# Patient Record
Sex: Female | Born: 1970 | Race: Black or African American | Hispanic: No | Marital: Single | Smoking: Never smoker
Health system: Southern US, Community
[De-identification: ages and names within clinical notes are randomized; demographics above are authoritative.]

## PROBLEM LIST (undated history)

## (undated) DIAGNOSIS — E119 Type 2 diabetes mellitus without complications: Secondary | ICD-10-CM

---

## 1998-07-20 ENCOUNTER — Emergency Department (HOSPITAL_COMMUNITY): Admission: EM | Admit: 1998-07-20 | Discharge: 1998-07-20 | Payer: Self-pay | Admitting: Emergency Medicine

## 2002-01-04 ENCOUNTER — Other Ambulatory Visit: Admission: RE | Admit: 2002-01-04 | Discharge: 2002-01-04 | Payer: Self-pay | Admitting: Internal Medicine

## 2002-01-12 ENCOUNTER — Emergency Department (HOSPITAL_COMMUNITY): Admission: EM | Admit: 2002-01-12 | Discharge: 2002-01-12 | Payer: Self-pay | Admitting: *Deleted

## 2002-01-12 ENCOUNTER — Encounter: Payer: Self-pay | Admitting: Emergency Medicine

## 2002-01-22 ENCOUNTER — Encounter: Payer: Self-pay | Admitting: Internal Medicine

## 2002-01-22 ENCOUNTER — Ambulatory Visit (HOSPITAL_COMMUNITY): Admission: RE | Admit: 2002-01-22 | Discharge: 2002-01-22 | Payer: Self-pay | Admitting: Internal Medicine

## 2002-03-09 ENCOUNTER — Encounter: Payer: Self-pay | Admitting: Internal Medicine

## 2002-03-09 ENCOUNTER — Ambulatory Visit (HOSPITAL_COMMUNITY): Admission: RE | Admit: 2002-03-09 | Discharge: 2002-03-09 | Payer: Self-pay | Admitting: Internal Medicine

## 2006-05-25 ENCOUNTER — Other Ambulatory Visit: Admission: RE | Admit: 2006-05-25 | Discharge: 2006-05-25 | Payer: Self-pay | Admitting: Endocrinology

## 2006-10-03 ENCOUNTER — Encounter: Admission: RE | Admit: 2006-10-03 | Discharge: 2006-10-03 | Payer: Self-pay | Admitting: *Deleted

## 2007-05-06 ENCOUNTER — Emergency Department (HOSPITAL_COMMUNITY): Admission: EM | Admit: 2007-05-06 | Discharge: 2007-05-06 | Payer: Self-pay | Admitting: Emergency Medicine

## 2007-08-21 ENCOUNTER — Other Ambulatory Visit: Admission: RE | Admit: 2007-08-21 | Discharge: 2007-08-21 | Payer: Self-pay | Admitting: Family Medicine

## 2008-09-03 ENCOUNTER — Other Ambulatory Visit: Admission: RE | Admit: 2008-09-03 | Discharge: 2008-09-03 | Payer: Self-pay | Admitting: Family Medicine

## 2009-09-23 ENCOUNTER — Other Ambulatory Visit: Admission: RE | Admit: 2009-09-23 | Discharge: 2009-09-23 | Payer: Self-pay | Admitting: Family Medicine

## 2009-09-30 ENCOUNTER — Encounter: Admission: RE | Admit: 2009-09-30 | Discharge: 2009-09-30 | Payer: Self-pay | Admitting: Internal Medicine

## 2010-10-01 ENCOUNTER — Encounter
Admission: RE | Admit: 2010-10-01 | Discharge: 2010-10-01 | Payer: Self-pay | Source: Home / Self Care | Attending: Internal Medicine | Admitting: Internal Medicine

## 2010-10-26 ENCOUNTER — Emergency Department (HOSPITAL_COMMUNITY)
Admission: EM | Admit: 2010-10-26 | Discharge: 2010-10-27 | Payer: Self-pay | Source: Home / Self Care | Admitting: Emergency Medicine

## 2011-02-12 ENCOUNTER — Other Ambulatory Visit (HOSPITAL_COMMUNITY)
Admission: RE | Admit: 2011-02-12 | Discharge: 2011-02-12 | Disposition: A | Discharge status: Home / Self Care | Payer: BC Managed Care – PPO | Source: Ambulatory Visit | Attending: Internal Medicine | Admitting: Internal Medicine

## 2011-02-12 ENCOUNTER — Other Ambulatory Visit: Payer: Self-pay

## 2011-02-12 DIAGNOSIS — Z01419 Encounter for gynecological examination (general) (routine) without abnormal findings: Secondary | ICD-10-CM | POA: Insufficient documentation

## 2011-11-12 ENCOUNTER — Other Ambulatory Visit: Payer: Self-pay | Admitting: Family Medicine

## 2011-11-12 DIAGNOSIS — Z1231 Encounter for screening mammogram for malignant neoplasm of breast: Secondary | ICD-10-CM

## 2011-11-22 ENCOUNTER — Ambulatory Visit
Admission: RE | Admit: 2011-11-22 | Discharge: 2011-11-22 | Disposition: A | Discharge status: Home / Self Care | Payer: BC Managed Care – PPO | Source: Ambulatory Visit | Attending: Family Medicine | Admitting: Family Medicine

## 2011-11-22 DIAGNOSIS — Z1231 Encounter for screening mammogram for malignant neoplasm of breast: Secondary | ICD-10-CM

## 2013-07-30 ENCOUNTER — Other Ambulatory Visit: Payer: Self-pay | Admitting: Family Medicine

## 2013-07-30 DIAGNOSIS — Z1231 Encounter for screening mammogram for malignant neoplasm of breast: Secondary | ICD-10-CM

## 2013-09-20 ENCOUNTER — Ambulatory Visit
Admission: RE | Admit: 2013-09-20 | Discharge: 2013-09-20 | Disposition: A | Discharge status: Home / Self Care | Payer: 59 | Source: Ambulatory Visit | Attending: Family Medicine | Admitting: Family Medicine

## 2013-09-20 DIAGNOSIS — Z1231 Encounter for screening mammogram for malignant neoplasm of breast: Secondary | ICD-10-CM

## 2014-02-09 ENCOUNTER — Encounter (HOSPITAL_COMMUNITY): Payer: Self-pay | Admitting: Emergency Medicine

## 2014-02-09 ENCOUNTER — Emergency Department (HOSPITAL_COMMUNITY)
Admission: EM | Admit: 2014-02-09 | Discharge: 2014-02-09 | Disposition: A | Discharge status: Home / Self Care | Payer: 59 | Source: Home / Self Care | Attending: Emergency Medicine | Admitting: Emergency Medicine

## 2014-02-09 DIAGNOSIS — J020 Streptococcal pharyngitis: Secondary | ICD-10-CM

## 2014-02-09 HISTORY — DX: Type 2 diabetes mellitus without complications: E11.9

## 2014-02-09 LAB — POCT RAPID STREP A: STREPTOCOCCUS, GROUP A SCREEN (DIRECT): POSITIVE — AB

## 2014-02-09 MED ORDER — AMOXICILLIN 500 MG PO CAPS: 500.0000 mg | ORAL_CAPSULE | Freq: Three times a day (TID) | ORAL | Status: DC

## 2014-02-09 MED ORDER — PREDNISONE 20 MG PO TABS: 20.0000 mg | ORAL_TABLET | Freq: Two times a day (BID) | ORAL | Status: DC

## 2014-02-09 NOTE — ED Provider Notes (Signed)
  Chief Complaint   Chief Complaint  Patient presents with  . Sore Throat    History of Present Illness   Michelle Mckinney is a 43 year old female who's had a two-day history of sore throat, pain on swallowing, and swollen glands. She denies fever, chills, headache, nasal congestion, rhinorrhea, or cough. No GI symptoms. No known exposure to strep. No prior history of strep.   Review of Systems   Other than as noted above, the patient denies any of the following symptoms. Systemic:  No fever, chills, sweats, myalgias, or headache. Eye:  No redness, pain or drainage. ENT:  No earache, nasal congestion, sneezing, rhinorrhea, sinus pressure, sinus pain, or post nasal drip. Lungs:  No cough, sputum production, wheezing, shortness of breath, or chest pain. GI:  No abdominal pain, nausea, vomiting, or diarrhea. Skin:  No rash.  PMFSH   Past medical history, family history, social history, meds, and allergies were reviewed. She takes the toes on metformin. She has a history of diabetes.  Physical Exam     Vital signs:  BP 124/53  Pulse 75  Temp(Src) 98.7 F (37.1 C) (Oral)  Resp 16  SpO2 96%  LMP 01/07/2014 General:  Alert, in no distress. Phonation was normal, no drooling, and patient was able to handle secretions well.  Eye:  No conjunctival injection or drainage. Lids were normal. ENT:  TMs and canals were normal, without erythema or inflammation.  Nasal mucosa was clear and uncongested, without drainage.  Mucous membranes were moist.  Exam of pharynx tonsils were enlarged and red with spots of white exudate.  There were no oral ulcerations or lesions. There was no bulging of the tonsillar pillars, and the uvula was midline. Neck:  Supple, no adenopathy, tenderness or mass. Lungs:  No respiratory distress.  Lungs were clear to auscultation, without wheezes, rales or rhonchi.  Breath sounds were clear and equal bilaterally.  Heart:  Regular rhythm, without gallops, murmers or  rubs. Skin:  Clear, warm, and dry, without rash or lesions.  Labs   Labs Reviewed  POCT RAPID STREP A ([facility] ONLY) - Abnormal; Notable for the following:    Streptococcus, Group A Screen (Direct) POSITIVE (*)    All other components within normal limits   Assessment   The encounter diagnosis was Strep throat.  There is no evidence of a peritonsillar abscess, retropharyngeal abscess, or epiglottitis.    Plan     1.  Meds:  The following meds were prescribed:   Discharge Medication List as of 02/09/2014 12:14 PM    START taking these medications   Details  amoxicillin (AMOXIL) 500 MG capsule Take 1 capsule (500 mg total) by mouth 3 (three) times daily., Starting 02/09/2014, Until Discontinued, Normal    predniSONE (DELTASONE) 20 MG tablet Take 1 tablet (20 mg total) by mouth 2 (two) times daily., Starting 02/09/2014, Until Discontinued, Normal        2.  Patient Education/Counseling:  The patient was given appropriate handouts, self care instructions, and instructed in symptomatic relief, including hot saline gargles, throat lozenges, infectious precautions, and need to trade out toothbrush.    3.  Follow up:  The patient was told to follow up here if no better in 3 to 4 days, or sooner if becoming worse in any way, and given some red flag symptoms such as difficulty swallowing or breathing which would prompt immediate return.       Reuben Likesavid C Kyanna Mahrt, MD 02/09/14 [phone]
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
## 2014-02-09 NOTE — Discharge Instructions (Signed)
Strep Throat  Strep throat is an infection of the throat caused by a bacteria named Streptococcus pyogenes. Your caregiver may call the infection streptococcal "tonsillitis" or "pharyngitis" depending on whether there are signs of inflammation in the tonsils or back of the throat. Strep throat is most common in children aged 42 15 years during the cold months of the year, but it can occur in people of any age during any season. This infection is spread from person to person (contagious) through coughing, sneezing, or other close contact.  SYMPTOMS   · Fever or chills.  · Painful, swollen, red tonsils or throat.  · Pain or difficulty when swallowing.  · White or yellow spots on the tonsils or throat.  · Swollen, tender lymph nodes or "glands" of the neck or under the jaw.  · Red rash all over the body (rare).  DIAGNOSIS   Many different infections can cause the same symptoms. A test must be done to confirm the diagnosis so the right treatment can be given. A "rapid strep test" can help your caregiver make the diagnosis in a few minutes. If this test is not available, a light swab of the infected area can be used for a throat culture test. If a throat culture test is done, results are usually available in a day or two.  TREATMENT   Strep throat is treated with antibiotic medicine.  HOME CARE INSTRUCTIONS   · Gargle with 1 tsp of salt in 1 cup of warm water, 3 4 times per day or as needed for comfort.  · Family members who also have a sore throat or fever should be tested for strep throat and treated with antibiotics if they have the strep infection.  · Make sure everyone in your household washes their hands well.  · Do not share food, drinking cups, or personal items that could cause the infection to spread to others.  · You may need to eat a soft food diet until your sore throat gets better.  · Drink enough water and fluids to keep your urine clear or pale yellow. This will help prevent dehydration.  · Get plenty of  rest.  · Stay home from school, daycare, or work until you have been on antibiotics for 24 hours.  · Only take over-the-counter or prescription medicines for pain, discomfort, or fever as directed by your caregiver.  · If antibiotics are prescribed, take them as directed. Finish them even if you start to feel better.  SEEK MEDICAL CARE IF:   · The glands in your neck continue to enlarge.  · You develop a rash, cough, or earache.  · You cough up green, yellow-brown, or bloody sputum.  · You have pain or discomfort not controlled by medicines.  · Your problems seem to be getting worse rather than better.  SEEK IMMEDIATE MEDICAL CARE IF:   · You develop any new symptoms such as vomiting, severe headache, stiff or painful neck, chest pain, shortness of breath, or trouble swallowing.  · You develop severe throat pain, drooling, or changes in your voice.  · You develop swelling of the neck, or the skin on the neck becomes red and tender.  · You have a fever.  · You develop signs of dehydration, such as fatigue, dry mouth, and decreased urination.  · You become increasingly sleepy, or you cannot wake up completely.  Document Released: 09/17/2000 Document Revised: 09/06/2012 Document Reviewed: 11/19/2010  ExitCare® Patient Information ©2014 [employer].

## 2014-09-03 ENCOUNTER — Encounter (HOSPITAL_COMMUNITY): Payer: Self-pay

## 2014-09-03 ENCOUNTER — Emergency Department (HOSPITAL_COMMUNITY)
Admission: EM | Admit: 2014-09-03 | Discharge: 2014-09-03 | Disposition: A | Discharge status: Home / Self Care | Payer: 59 | Attending: Emergency Medicine | Admitting: Emergency Medicine

## 2014-09-03 DIAGNOSIS — Z792 Long term (current) use of antibiotics: Secondary | ICD-10-CM | POA: Insufficient documentation

## 2014-09-03 DIAGNOSIS — S3992XA Unspecified injury of lower back, initial encounter: Secondary | ICD-10-CM | POA: Diagnosis not present

## 2014-09-03 DIAGNOSIS — E119 Type 2 diabetes mellitus without complications: Secondary | ICD-10-CM | POA: Insufficient documentation

## 2014-09-03 DIAGNOSIS — Z79899 Other long term (current) drug therapy: Secondary | ICD-10-CM | POA: Diagnosis not present

## 2014-09-03 DIAGNOSIS — Y9389 Activity, other specified: Secondary | ICD-10-CM | POA: Diagnosis not present

## 2014-09-03 DIAGNOSIS — Y92410 Unspecified street and highway as the place of occurrence of the external cause: Secondary | ICD-10-CM | POA: Diagnosis not present

## 2014-09-03 DIAGNOSIS — Z7952 Long term (current) use of systemic steroids: Secondary | ICD-10-CM | POA: Insufficient documentation

## 2014-09-03 DIAGNOSIS — Y998 Other external cause status: Secondary | ICD-10-CM | POA: Insufficient documentation

## 2014-09-03 MED ORDER — HYDROCODONE-ACETAMINOPHEN 5-325 MG PO TABS: 1.0000 | ORAL_TABLET | Freq: Four times a day (QID) | ORAL | Status: DC | PRN

## 2014-09-03 MED ORDER — HYDROCODONE-ACETAMINOPHEN 5-325 MG PO TABS: 2.0000 | ORAL_TABLET | Freq: Once | ORAL | Status: DC

## 2014-09-03 MED ORDER — HYDROCODONE-ACETAMINOPHEN 5-325 MG PO TABS
2.0000 | ORAL_TABLET | Freq: Once | ORAL | Status: AC
  Administered 2014-09-03: 2 via ORAL
  Filled 2014-09-03: qty 2
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
## 2014-09-03 NOTE — ED Notes (Signed)
MD at bedside. 
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
## 2014-09-11 ENCOUNTER — Other Ambulatory Visit: Payer: Self-pay

## 2014-09-11 DIAGNOSIS — Z1231 Encounter for screening mammogram for malignant neoplasm of breast: Secondary | ICD-10-CM

## 2014-09-23 ENCOUNTER — Ambulatory Visit
Admission: RE | Admit: 2014-09-23 | Discharge: 2014-09-23 | Disposition: A | Discharge status: Home / Self Care | Payer: 59 | Source: Ambulatory Visit

## 2014-09-23 DIAGNOSIS — Z1231 Encounter for screening mammogram for malignant neoplasm of breast: Secondary | ICD-10-CM

## 2014-10-07 ENCOUNTER — Other Ambulatory Visit: Payer: Self-pay | Admitting: Family Medicine

## 2014-10-07 ENCOUNTER — Other Ambulatory Visit (HOSPITAL_COMMUNITY)
Admission: RE | Admit: 2014-10-07 | Discharge: 2014-10-07 | Disposition: A | Discharge status: Home / Self Care | Payer: 59 | Source: Ambulatory Visit | Attending: Family Medicine | Admitting: Family Medicine

## 2014-10-07 DIAGNOSIS — Z01419 Encounter for gynecological examination (general) (routine) without abnormal findings: Secondary | ICD-10-CM | POA: Diagnosis not present

## 2014-10-09 LAB — CYTOLOGY - PAP

## 2015-10-22 ENCOUNTER — Other Ambulatory Visit: Payer: Self-pay

## 2015-10-22 DIAGNOSIS — Z1231 Encounter for screening mammogram for malignant neoplasm of breast: Secondary | ICD-10-CM

## 2015-11-04 ENCOUNTER — Ambulatory Visit
Admission: RE | Admit: 2015-11-04 | Discharge: 2015-11-04 | Disposition: A | Discharge status: Home / Self Care | Payer: BLUE CROSS/BLUE SHIELD | Source: Ambulatory Visit

## 2015-11-04 DIAGNOSIS — Z1231 Encounter for screening mammogram for malignant neoplasm of breast: Secondary | ICD-10-CM

## 2016-01-21 ENCOUNTER — Encounter: Payer: Self-pay | Admitting: Endocrinology

## 2016-01-21 ENCOUNTER — Ambulatory Visit (INDEPENDENT_AMBULATORY_CARE_PROVIDER_SITE_OTHER): Payer: BLUE CROSS/BLUE SHIELD | Admitting: Endocrinology

## 2016-01-21 VITALS — BP 126/82 | HR 81 | Temp 98.5°F | Resp 16 | Ht 67.0 in | Wt 205.4 lb

## 2016-01-21 DIAGNOSIS — E7800 Pure hypercholesterolemia, unspecified: Secondary | ICD-10-CM | POA: Diagnosis not present

## 2016-01-21 DIAGNOSIS — E1165 Type 2 diabetes mellitus with hyperglycemia: Secondary | ICD-10-CM | POA: Diagnosis not present

## 2016-01-21 LAB — POCT GLUCOSE (DEVICE FOR HOME USE): GLUCOSE FASTING, POC: 216 mg/dL — AB (ref 70–99)

## 2016-01-21 LAB — POCT GLYCOSYLATED HEMOGLOBIN (HGB A1C): HEMOGLOBIN A1C: 7.8

## 2016-01-21 MED ORDER — CANAGLIFLOZIN 100 MG PO TABS: ORAL_TABLET | ORAL | Status: DC

## 2016-01-21 NOTE — Patient Instructions (Signed)
Check blood sugars on waking up 3  times a week  Also check blood sugars about 2 hours after a meal and do this after different meals by rotation  Recommended blood sugar levels on waking up is 90-130 and about 2 hours after meal is 130-160  Please bring your blood sugar monitor to each visit, thank you  

## 2016-01-21 NOTE — Progress Notes (Signed)
Patient ID: Michelle Mckinney, female   DOB: Feb 01, 1971, 45 y.o.   MRN: [id number]           Reason for Appointment: Consultation for Type 2 Diabetes  Referring physician: Fulp   History of Present Illness:          Date of diagnosis of type 2 diabetes mellitus: 2014?       Background history:   She was initially diagnosed to have diabetes when she had symptoms of yeast infections, she thinks her blood sugar was just over 200.  She was initially given metformin and also subsequently tried on Januvia which she does not think helped much  Victoza was started probably in late 2014 but details are not available; she thinks initially with starting this her A1c may have gone down 6.1 However current information indicates her A1c has been over 9% since at least 11/2013  Recent history:   She is referred here for further management of her diabetes because of her persistently high A1c, last 10.7  Current management, blood sugar patterns and problems identified:  She thinks her blood sugars are relatively high 2-3 months ago my reading about 180-200 in the morning  Although her treatment regimen was not changed in her office visit about 2 months ago she was told to get back on her diet and start exercise which she has started doing especially in the last month  With this she has lost 5 pounds and she thinks her sugars are better although she checks readings in the mornings only  She has difficulty tolerating 1000 mg metformin tablets and has tried only the regular formulation  Non-insulin hypoglycemic drugs the patient is taking are: Victoza 1.8 mg daily, metformin 500 mg twice a day      Side effects from medications have been: Diarrhea from 1000 mg metformin  Compliance with the medical regimen: Inconsistent  Glucose monitoring:  done less than 2 times a day         Glucometer: One Touch.      Blood Glucose readings by recall   PREMEAL Breakfast Lunch Dinner Bedtime  Overall   Glucose  range: 130  110    Median:         Self-care: The diet that the patient has been following is: tries to limit carbohydrates .     Meal times are: He Dinner: 6-7 pm  Typical meal intake: Breakfast is oatmeal               Dietician visit, most recent:               Exercise:  1 mth 5/7; 60 min  Weight history: Wt Readings from Last 3 Encounters:  01/21/16 205 lb 6.4 oz (93.169 kg)  09/03/14 205 lb (92.987 kg)    Glycemic control:   Lab Results  Component Value Date   HGBA1C 7.8 01/21/2016   No results found for: GLUF, MICROALBUR, LDLCALC, CREATININE No results found for: MICRALBCREAT        Medication List       This list is accurate as of: 01/21/16  4:10 PM.  Always use your most recent med list.               BD PEN NEEDLE NANO U/F 32G X 4 MM Misc  Generic drug:  Insulin Pen Needle  USE TO INJ VICTOZA ONCE A DAY SUBCUTANEOUS 90 DAYS     metFORMIN 500 MG tablet  Commonly known as:  GLUCOPHAGE  Take by mouth daily with breakfast.     multivitamin with minerals Tabs tablet  Take 1 tablet by mouth daily.     ONE TOUCH ULTRA TEST test strip  Generic drug:  glucose blood  1 each by Other route as needed for other. Use as instructed     VICTOZA [place]  Inject into the skin.     vitamin E 1000 UNIT capsule  Take 1,000 Units by mouth daily.        Allergies: No Known Allergies  Past Medical History  Diagnosis Date  . Diabetes mellitus without complication (HCC)     No past surgical history on file.  Family History  Problem Relation Age of Onset  . Cancer Father   . Diabetes Sister   . Diabetes Maternal Aunt   . Hypertension Maternal Grandmother   . Stroke Maternal Grandfather   . Heart disease Paternal Grandfather     Social History:  reports that she has never smoked. She does not have any smokeless tobacco history on file. She reports that she does not drink alcohol. Her drug history is not on file.    Review of Systems    Lipid history:  Her last LDL was 134 in 1/17, previously 138 with normal triglycerides, HDL 33   No results found for: CHOL, HDL, LDLCALC, LDLDIRECT, TRIG, CHOLHDL         Hypertension:none  Most recent eye exam was 2015  Most recent foot exam: 01/2016  Review of Systems  Constitutional: Positive for weight loss. Negative for reduced appetite.  Eyes: Negative for blurred vision.  Respiratory: Negative for shortness of breath.   Cardiovascular: Negative for leg swelling.  Gastrointestinal: Negative for constipation and diarrhea.  Endocrine: Negative for fatigue.  Genitourinary: Positive for frequency.  Musculoskeletal: Negative for joint pain.  Skin: Negative for rash.  Neurological: Negative for numbness and tingling.  Psychiatric/Behavioral: Negative for insomnia.      LABS:  Office Visit on 01/21/2016  Component Date Value Ref Range Status  . Hemoglobin A1C 01/21/2016 7.8   Final  . Glucose Fasting, POC 01/21/2016 216* 70 - 99 mg/dL Final    Physical Examination:  BP 126/82 mmHg  Pulse 81  Temp(Src) 98.5 F (36.9 C)  Resp 16  Ht  (1.702 m)  Wt 205 lb 6.4 oz (93.169 kg)  BMI 32.16 kg/m2  SpO2 97%  GENERAL:         Patient has generalized obesity.   HEENT:         Eye exam shows normal external appearance. Fundus exam shows no retinopathy.  Oral exam shows normal mucosa .  NECK:   There is no lymphadenopathy Thyroid is not enlarged and no nodules felt.  Carotids are normal to palpation and no bruit heard LUNGS:       are clear to auscultation.Marland Kitchen   HEART:         Heart sounds:  S1 and S2 are normal. No murmur or click heard., no S3 or S4.   ABDOMEN:   There is no distention present. Liver and spleen are not palpable. No other mass or tenderness present.   NEUROLOGICAL:   Ankle jerks are absent bilaterally.Biceps reflexes are normal     Diabetic Foot Exam - Simple   Simple Foot Form  Diabetic Foot exam was performed with the following findings:  Yes 01/21/2016  3:05 PM    Visual Inspection  No deformities, no ulcerations, no other skin breakdown bilaterally:  Yes  Sensation Testing  Intact to touch and monofilament testing bilaterally:  Yes  Pulse Check  Posterior Tibialis and Dorsalis pulse intact bilaterally:  Yes  Comments             Vibration sense is Nearly normal  in distal first toes. MUSCULOSKELETAL:  There is no swelling or deformity of the peripheral joints. Spine is normal to inspection.   EXTREMITIES:     There is no edema. No skin lesions present.Marland Kitchen. SKIN:       No rash or lesions of concern.        ASSESSMENT:  Diabetes type 2, Obese, uncontrolled with A1c persistently over 9% in the last 2 years Currently on low-dose metformin and also Victoza However recently in the last 1-2 months with better compliance with diet and exercise regimen she appears to have improved blood sugars and A1c is 7.8  She probably has significant insulin resistance and some insulin deficiency Does need to lose weight and maintain BMI under at least  She is a good candidate for medication like Invokana as an add-on therapy for improved control, consistent long-term control and weight loss Discussed action of SGLT 2 drugs on lowering glucose by decreasing kidney absorption of glucose, benefits of weight loss and lower blood pressure, possible side effects including candidiasis and dosage regimen  She was also given the support program related to the medication and she can sign up at the website  given  Complications of diabetes: None, needs an eye exam which is overdue.  No evidence of nephropathy or neuropathy  HYPERLIPIDEMIA: Currently uncontrolled and untreated, LDL over 100  PLAN:     Invokana 100 mg daily in the morning  Consider metformin ER instead of regular metformin so that she can increase the dose to at least 1500 mg on the next visit  Continue Victoza 1.8 mg  To bring blood sugar monitor on each visit  Consultation with dietitian  Check  readings 2 hours after meals especially suppertime  Continue exercise regimen  Will recheck her lipids when blood sugars are better controlled, discussed importance of cardiovascular protection afforded by statin drugs and she most likely will need to start these  Needs regular  eye exams at least every 2 years, may wait to schedule till blood sugar control is better  Patient Instructions  Check blood sugars on waking up  3  times a week Also check blood sugars about 2 hours after a meal and do this after different meals by rotation  Recommended blood sugar levels on waking up is 90-130 and about 2 hours after meal is 130-160  Please bring your blood sugar monitor to each visit, thank you    Counseling time on subjects discussed above is over 50% of today's 50 minute visit  Anders Hohmann 01/21/2016, 4:10 PM   Note: This office note was prepared with Dragon voice recognition system technology. Any transcriptional errors that result from this process are unintentional.

## 2016-02-13 ENCOUNTER — Other Ambulatory Visit: Payer: BLUE CROSS/BLUE SHIELD

## 2016-02-18 ENCOUNTER — Ambulatory Visit: Payer: BLUE CROSS/BLUE SHIELD | Admitting: Endocrinology

## 2016-03-11 ENCOUNTER — Ambulatory Visit: Payer: BLUE CROSS/BLUE SHIELD | Admitting: Dietician

## 2016-05-10 ENCOUNTER — Other Ambulatory Visit: Payer: Self-pay | Admitting: Endocrinology

## 2016-09-03 ENCOUNTER — Other Ambulatory Visit: Payer: Self-pay | Admitting: Endocrinology

## 2016-11-15 ENCOUNTER — Other Ambulatory Visit: Payer: Self-pay | Admitting: Family Medicine

## 2016-11-15 DIAGNOSIS — Z1231 Encounter for screening mammogram for malignant neoplasm of breast: Secondary | ICD-10-CM

## 2016-11-24 ENCOUNTER — Other Ambulatory Visit: Payer: Self-pay | Admitting: Family

## 2016-11-24 DIAGNOSIS — Z1231 Encounter for screening mammogram for malignant neoplasm of breast: Secondary | ICD-10-CM

## 2016-12-09 ENCOUNTER — Ambulatory Visit
Admission: RE | Admit: 2016-12-09 | Discharge: 2016-12-09 | Disposition: A | Discharge status: Home / Self Care | Payer: BLUE CROSS/BLUE SHIELD | Source: Ambulatory Visit | Attending: Family | Admitting: Family

## 2016-12-09 DIAGNOSIS — Z1231 Encounter for screening mammogram for malignant neoplasm of breast: Secondary | ICD-10-CM | POA: Diagnosis not present

## 2016-12-20 DIAGNOSIS — E785 Hyperlipidemia, unspecified: Secondary | ICD-10-CM | POA: Diagnosis not present

## 2016-12-20 DIAGNOSIS — Z0001 Encounter for general adult medical examination with abnormal findings: Secondary | ICD-10-CM | POA: Diagnosis not present

## 2016-12-20 DIAGNOSIS — E1165 Type 2 diabetes mellitus with hyperglycemia: Secondary | ICD-10-CM | POA: Diagnosis not present

## 2017-04-09 ENCOUNTER — Encounter (HOSPITAL_COMMUNITY): Payer: Self-pay | Admitting: Emergency Medicine

## 2017-04-09 ENCOUNTER — Ambulatory Visit (HOSPITAL_COMMUNITY)
Admission: EM | Admit: 2017-04-09 | Discharge: 2017-04-09 | Disposition: A | Discharge status: Home / Self Care | Payer: BLUE CROSS/BLUE SHIELD | Attending: Family Medicine | Admitting: Family Medicine

## 2017-04-09 DIAGNOSIS — J0100 Acute maxillary sinusitis, unspecified: Secondary | ICD-10-CM

## 2017-04-09 MED ORDER — FLUCONAZOLE 150 MG PO TABS: 150.0000 mg | ORAL_TABLET | Freq: Once | ORAL | 1 refills | Status: AC

## 2017-04-09 MED ORDER — AMOXICILLIN-POT CLAVULANATE 875-125 MG PO TABS: 1.0000 | ORAL_TABLET | Freq: Two times a day (BID) | ORAL | 0 refills | Status: DC

## 2017-04-09 NOTE — Discharge Instructions (Signed)
Today you were diagnosed with the following: 1. Acute non-recurrent maxillary sinusitis    You have been prescribed prescription medications this visit.   Meds ordered this encounter  Medications   amoxicillin-clavulanate (AUGMENTIN) 875-125 MG tablet    Sig: Take 1 tablet by mouth every 12 (twelve) hours.    Dispense:  20 tablet    Refill:  0   fluconazole (DIFLUCAN) 150 MG tablet    Sig: Take 1 tablet (150 mg total) by mouth once.    Dispense:  1 tablet    Refill:  1    Please take all medications as directed.  If you are not improving over the next few days or feel you are worsening please follow up here or the Emergency Department if you are unable to see your regular doctor.

## 2017-04-09 NOTE — ED Provider Notes (Signed)
  [facility]   [id number] 04/09/17 Arrival Time: 1737  ASSESSMENT & PLAN:  Today you were diagnosed with the following: 1. Acute non-recurrent maxillary sinusitis    You have been prescribed prescription medications this visit.   Meds ordered this encounter  Medications  . amoxicillin-clavulanate (AUGMENTIN) 875-125 MG tablet    Sig: Take 1 tablet by mouth every 12 (twelve) hours.    Dispense:  20 tablet    Refill:  0  . fluconazole (DIFLUCAN) 150 MG tablet    Sig: Take 1 tablet (150 mg total) by mouth once.    Dispense:  1 tablet    Refill:  1   Please take all medications as directed.  If you are not improving over the next few days or feel you are worsening please follow up here or the Emergency Department if you are unable to see your regular doctor.  Reviewed expectations re: course of current medical issues. Questions answered. Outlined signs and symptoms indicating need for more acute intervention. Patient verbalized understanding. After Visit Summary given.  SUBJECTIVE:  Michelle Mckinney is a 46 y.o. female who presents with complaint of URI symptoms approx 2 weeks ago that were resolving. Was feeling better for 1-2 days then sinus pressure started. Has been worsening. Still with nasal congestion. Some pain in teeth. Afebrile. OTC without much relief. Non-smoker. No resp symptoms.  ROS: As per HPI.   OBJECTIVE:  Vitals:   04/09/17 1829  BP: 122/67  Pulse: 61  Resp: 18  Temp: 98.6 F (37 C)  TempSrc: Oral  SpO2: 100%    General appearance: alert; no distress Nose: nasal congestion; maxillary sinus tenderness Throat: lips, mucosa, and tongue normal; teeth and gums normal Lungs: clear to auscultation bilaterally Skin: warm and dry  No Known Allergies  PMHx, SurgHx, SocialHx, Medications, and Allergies were reviewed in the Visit Navigator and updated as appropriate.      Mardella LaymanHagler, Michelle Waltermire, MD 04/10/17 1147

## 2017-04-09 NOTE — ED Triage Notes (Signed)
Patient has headache, pressure behind right eye.  Face has a lot of pressure

## 2017-11-07 DIAGNOSIS — T148XXA Other injury of unspecified body region, initial encounter: Secondary | ICD-10-CM | POA: Diagnosis not present

## 2017-11-07 DIAGNOSIS — S6992XA Unspecified injury of left wrist, hand and finger(s), initial encounter: Secondary | ICD-10-CM | POA: Diagnosis not present

## 2017-11-07 DIAGNOSIS — E119 Type 2 diabetes mellitus without complications: Secondary | ICD-10-CM | POA: Diagnosis not present

## 2017-11-07 DIAGNOSIS — M79642 Pain in left hand: Secondary | ICD-10-CM | POA: Diagnosis not present

## 2017-11-07 DIAGNOSIS — R51 Headache: Secondary | ICD-10-CM | POA: Diagnosis not present

## 2017-11-07 DIAGNOSIS — S0101XA Laceration without foreign body of scalp, initial encounter: Secondary | ICD-10-CM | POA: Diagnosis not present

## 2017-11-07 DIAGNOSIS — S0003XA Contusion of scalp, initial encounter: Secondary | ICD-10-CM | POA: Diagnosis not present

## 2017-11-07 DIAGNOSIS — S0181XA Laceration without foreign body of other part of head, initial encounter: Secondary | ICD-10-CM | POA: Diagnosis not present

## 2017-11-07 DIAGNOSIS — Z7984 Long term (current) use of oral hypoglycemic drugs: Secondary | ICD-10-CM | POA: Diagnosis not present

## 2017-11-10 DIAGNOSIS — S060X0D Concussion without loss of consciousness, subsequent encounter: Secondary | ICD-10-CM | POA: Diagnosis not present

## 2017-11-10 DIAGNOSIS — S6392XA Sprain of unspecified part of left wrist and hand, initial encounter: Secondary | ICD-10-CM | POA: Diagnosis not present

## 2017-11-10 DIAGNOSIS — E1165 Type 2 diabetes mellitus with hyperglycemia: Secondary | ICD-10-CM | POA: Diagnosis not present

## 2017-11-10 DIAGNOSIS — S93491A Sprain of other ligament of right ankle, initial encounter: Secondary | ICD-10-CM | POA: Diagnosis not present

## 2017-11-14 ENCOUNTER — Other Ambulatory Visit: Payer: Self-pay | Admitting: Internal Medicine

## 2017-11-14 DIAGNOSIS — Z1231 Encounter for screening mammogram for malignant neoplasm of breast: Secondary | ICD-10-CM

## 2017-11-17 DIAGNOSIS — H40023 Open angle with borderline findings, high risk, bilateral: Secondary | ICD-10-CM | POA: Diagnosis not present

## 2017-11-22 DIAGNOSIS — F0781 Postconcussional syndrome: Secondary | ICD-10-CM | POA: Diagnosis not present

## 2017-11-22 DIAGNOSIS — S93401A Sprain of unspecified ligament of right ankle, initial encounter: Secondary | ICD-10-CM | POA: Diagnosis not present

## 2017-11-22 DIAGNOSIS — M79642 Pain in left hand: Secondary | ICD-10-CM | POA: Diagnosis not present

## 2017-12-02 DIAGNOSIS — F0781 Postconcussional syndrome: Secondary | ICD-10-CM | POA: Diagnosis not present

## 2017-12-02 DIAGNOSIS — S638X2D Sprain of other part of left wrist and hand, subsequent encounter: Secondary | ICD-10-CM | POA: Diagnosis not present

## 2017-12-02 DIAGNOSIS — M62838 Other muscle spasm: Secondary | ICD-10-CM | POA: Diagnosis not present

## 2017-12-12 ENCOUNTER — Ambulatory Visit
Admission: RE | Admit: 2017-12-12 | Discharge: 2017-12-12 | Disposition: A | Discharge status: Home / Self Care | Payer: BLUE CROSS/BLUE SHIELD | Source: Ambulatory Visit | Attending: Internal Medicine | Admitting: Internal Medicine

## 2017-12-12 DIAGNOSIS — Z1231 Encounter for screening mammogram for malignant neoplasm of breast: Secondary | ICD-10-CM | POA: Diagnosis not present

## 2017-12-16 DIAGNOSIS — F0781 Postconcussional syndrome: Secondary | ICD-10-CM | POA: Diagnosis not present

## 2017-12-29 DIAGNOSIS — S60222A Contusion of left hand, initial encounter: Secondary | ICD-10-CM | POA: Diagnosis not present

## 2017-12-29 DIAGNOSIS — M79642 Pain in left hand: Secondary | ICD-10-CM | POA: Diagnosis not present

## 2018-01-28 DIAGNOSIS — L03312 Cellulitis of back [any part except buttock]: Secondary | ICD-10-CM | POA: Diagnosis not present

## 2018-02-15 DIAGNOSIS — E1165 Type 2 diabetes mellitus with hyperglycemia: Secondary | ICD-10-CM | POA: Diagnosis not present

## 2018-02-15 DIAGNOSIS — Z124 Encounter for screening for malignant neoplasm of cervix: Secondary | ICD-10-CM | POA: Diagnosis not present

## 2018-02-15 DIAGNOSIS — R51 Headache: Secondary | ICD-10-CM | POA: Diagnosis not present

## 2018-02-15 DIAGNOSIS — E785 Hyperlipidemia, unspecified: Secondary | ICD-10-CM | POA: Diagnosis not present

## 2018-02-15 DIAGNOSIS — Z0000 Encounter for general adult medical examination without abnormal findings: Secondary | ICD-10-CM | POA: Diagnosis not present

## 2018-05-03 ENCOUNTER — Other Ambulatory Visit: Payer: Self-pay

## 2018-05-03 ENCOUNTER — Encounter (HOSPITAL_COMMUNITY): Payer: Self-pay | Admitting: Emergency Medicine

## 2018-05-03 ENCOUNTER — Ambulatory Visit (HOSPITAL_COMMUNITY)
Admission: EM | Admit: 2018-05-03 | Discharge: 2018-05-03 | Disposition: A | Discharge status: Home / Self Care | Payer: BLUE CROSS/BLUE SHIELD | Attending: Internal Medicine | Admitting: Internal Medicine

## 2018-05-03 DIAGNOSIS — J019 Acute sinusitis, unspecified: Secondary | ICD-10-CM

## 2018-05-03 MED ORDER — FLUCONAZOLE 150 MG PO TABS: 150.0000 mg | ORAL_TABLET | Freq: Once | ORAL | 0 refills | Status: AC

## 2018-05-03 MED ORDER — AMOXICILLIN-POT CLAVULANATE 875-125 MG PO TABS: 1.0000 | ORAL_TABLET | Freq: Two times a day (BID) | ORAL | 0 refills | Status: AC

## 2018-05-03 NOTE — ED Provider Notes (Signed)
[facility]    CSN: [id number] Arrival date & time: 05/03/18  1532     History   Chief Complaint Chief Complaint  Patient presents with  . URI    HPI Michelle Mckinney is a 47 y.o. female history of DM type II, Patient is presenting with URI symptoms- congestion, cough, sore throat.  Sore throat has relatively resolved. patient's main complaints are congestion and right ear pain.  She has felt fluid in her right ear, causing an achy sensation.  Symptoms have been going on for approximately 10 days. Patient has tried Flonase, Mucinex, Tylenol severe, with minimal relief. Denies fever, nausea, vomiting, diarrhea. Denies shortness of breath and chest pain.    HPI  Past Medical History:  Diagnosis Date  . Diabetes mellitus without complication [facility])     Patient Active Problem List   Diagnosis Date Noted  . Uncontrolled type 2 diabetes mellitus with hyperglycemia, without long-term current use of insulin (HCC) 01/21/2016    History reviewed. No pertinent surgical history.  OB History   None      Home Medications    Prior to Admission medications   Medication Sig Start Date End Date Taking? Authorizing Provider  BD PEN NEEDLE NANO U/F 32G X 4 MM MISC USE TO INJ VICTOZA ONCE A DAY SUBCUTANEOUS 90 DAYS 12/22/15  Yes [provider]  glucose blood (ONE TOUCH ULTRA TEST) test strip 1 each by Other route as needed for other. Use as instructed   Yes [provider]  Liraglutide (VICTOZA [place]) Inject into the skin.   Yes [provider]  metFORMIN (GLUCOPHAGE) 500 MG tablet Take by mouth daily with breakfast.   Yes [provider]  Multiple Vitamin (MULTIVITAMIN WITH MINERALS) TABS tablet Take 1 tablet by mouth daily.   Yes [provider]  vitamin E 1000 UNIT capsule Take 1,000 Units by mouth daily.   Yes [provider]  amoxicillin-clavulanate (AUGMENTIN) 875-125 MG tablet Take 1 tablet by mouth every 12 (twelve) hours  for 10 days. 05/03/18 05/13/18  Ruthie Berch, Junius Creamer, PA-C    Family History Family History  Problem Relation Age of Onset  . Cancer Father   . Diabetes Sister   . Diabetes Maternal Aunt   . Breast cancer Maternal Aunt   . Hypertension Maternal Grandmother   . Stroke Maternal Grandfather   . Heart disease Paternal Grandfather     Social History Social History   Tobacco Use  . Smoking status: Never Smoker  . Smokeless tobacco: Never Used  Substance Use Topics  . Alcohol use: No  . Drug use: Not on file     Allergies   Patient has no known allergies.   Review of Systems Review of Systems  Constitutional: Negative for activity change, appetite change, chills, fatigue and fever.  HENT: Positive for congestion, rhinorrhea and sinus pressure. Negative for ear pain, sore throat and trouble swallowing.   Eyes: Negative for discharge and redness.  Respiratory: Negative for cough, chest tightness and shortness of breath.   Cardiovascular: Negative for chest pain.  Gastrointestinal: Negative for abdominal pain, diarrhea, nausea and vomiting.  Musculoskeletal: Negative for myalgias.  Skin: Negative for rash.  Neurological: Negative for dizziness, light-headedness and headaches.     Physical Exam Triage Vital Signs ED Triage Vitals  Enc Vitals Group     BP 05/03/18 1540 137/87     Pulse Rate 05/03/18 1540 81     Resp 05/03/18 1540 18  Temp 05/03/18 1540 98.2 F (36.8 C)     Temp Source 05/03/18 1540 Oral     SpO2 05/03/18 1540 99 %     Weight --      Height --      Head Circumference --      Peak Flow --      Pain Score 05/03/18 1541 0     Pain Loc --      Pain Edu? --      Excl. in GC? --    No data found.  Updated Vital Signs BP 137/87 (BP Location: Left Arm)   Pulse 81   Temp 98.2 F (36.8 C) (Oral)   Resp 18   LMP 04/09/2018 (Exact Date)   SpO2 99%   Visual Acuity Right Eye Distance:   Left Eye Distance:   Bilateral Distance:    Right Eye Near:     Left Eye Near:    Bilateral Near:     Physical Exam  Constitutional: She appears well-developed and well-nourished. No distress.  HENT:  Head: Normocephalic and atraumatic.  Bilateral ears without tenderness to palpation of external auricle, tragus and mastoid, EAC's without erythema or swelling, TM's with good bony landmarks and cone of light. Non erythematous.  Fluid does appear to be present behind right TM, but without erythema.  Oral mucosa pink and moist, no tonsillar enlargement or exudate. Posterior pharynx patent and nonerythematous, no uvula deviation or swelling. Normal phonation.  Eyes: Conjunctivae are normal.  Neck: Neck supple.  Cardiovascular: Normal rate and regular rhythm.  No murmur heard. Pulmonary/Chest: Effort normal and breath sounds normal. No respiratory distress.  Breathing comfortably at rest, CTABL, no wheezing, rales or other adventitious sounds auscultated  Abdominal: Soft. There is no tenderness.  Musculoskeletal: She exhibits no edema.  Neurological: She is alert.  Skin: Skin is warm and dry.  Psychiatric: She has a normal mood and affect.  Nursing note and vitals reviewed.    UC Treatments / Results  Labs (all labs ordered are listed, but only abnormal results are displayed) Labs Reviewed - No data to display  EKG None  Radiology No results found.  Procedures Procedures (including critical care time)  Medications Ordered in UC Medications - No data to display  Initial Impression / Assessment and Plan / UC Course  I have reviewed the triage vital signs and the nursing notes.  Pertinent labs & imaging results that were available during my care of the patient were reviewed by me and considered in my medical decision making (see chart for details).     Patient with URI symptoms for 10 days, will go ahead and treat for sinusitis with Augmentin.  Will recommend to continue symptomatic management as well, will add in Zyrtec/Claritin with  Flonase.  Discussed further OTC recommendations.Discussed strict return precautions. Patient verbalized understanding and is agreeable with plan.  Final Clinical Impressions(s) / UC Diagnoses   Final diagnoses:  Acute sinusitis with symptoms > 10 days     Discharge Instructions     Please begin taking Augmentin twice daily for the next 10 days Please add in daily allergy pill like Zyrtec or Claritin daily for the next 1 to 2 weeks, please continue to pare with the Flonase nasal spray or Nasacort Treating your congestion should continue to improve her ear pain; you may use Tylenol or ibuprofen for discomfort in the meantime  Please continue to monitor your symptoms, please follow-up if symptoms are not improving, worsening, developing shortness of  breath, difficulty breathing, chest discomfort, fevers   ED Prescriptions    Medication Sig Dispense Auth. Provider   amoxicillin-clavulanate (AUGMENTIN) 875-125 MG tablet Take 1 tablet by mouth every 12 (twelve) hours for 10 days. 20 tablet Sharlene Mccluskey, [place] C, PA-C     Controlled Substance Prescriptions [place] Controlled Substance Registry consulted? Not Applicable   Lew Dawes, [place] 05/03/18 1605

## 2018-05-03 NOTE — ED Triage Notes (Signed)
Pt reports chest and nasal congestion and drainage with cough, right ear fullness and headache for 1 week.

## 2018-05-03 NOTE — Discharge Instructions (Signed)
Please begin taking Augmentin twice daily for the next 10 days Please add in daily allergy pill like Zyrtec or Claritin daily for the next 1 to 2 weeks, please continue to pare with the Flonase nasal spray or Nasacort Treating your congestion should continue to improve her ear pain; you may use Tylenol or ibuprofen for discomfort in the meantime  Please continue to monitor your symptoms, please follow-up if symptoms are not improving, worsening, developing shortness of breath, difficulty breathing, chest discomfort, fevers

## 2018-05-25 ENCOUNTER — Ambulatory Visit (HOSPITAL_COMMUNITY)
Admission: EM | Admit: 2018-05-25 | Discharge: 2018-05-25 | Disposition: A | Discharge status: Home / Self Care | Payer: BLUE CROSS/BLUE SHIELD | Attending: Family Medicine | Admitting: Family Medicine

## 2018-05-25 ENCOUNTER — Encounter (HOSPITAL_COMMUNITY): Payer: Self-pay

## 2018-05-25 ENCOUNTER — Other Ambulatory Visit: Payer: Self-pay

## 2018-05-25 DIAGNOSIS — R220 Localized swelling, mass and lump, head: Secondary | ICD-10-CM

## 2018-05-25 NOTE — ED Triage Notes (Signed)
[facility] from rt eye to below chin since yesterday 05/24/18, pt thinks may be possible allergic reaction.

## 2018-05-25 NOTE — Discharge Instructions (Addendum)
Continue Benadryl for the next 24 hours. 

## 2018-05-30 NOTE — ED Provider Notes (Signed)
[facility]   [id number] 05/25/18 Arrival Time: 1904  ASSESSMENT & PLAN:  1. Facial swelling    Question allergic reaction. Improving with Benadryl. Continue for the next 24 hours.  Follow-up Information    Boneta LucksBrown, Jennifer, NP.   Specialty:  Nurse Practitioner Why:  As needed. Contact information: [address] [place] [address] [phone]        [facility].   Specialty:  Urgent Care Why:  If symptoms worsen. Contact information: [address] [place] [id number] [phone]         Reviewed expectations re: course of current medical issues. Questions answered. Outlined signs and symptoms indicating need for more acute intervention. Patient verbalized understanding. After Visit Summary given.   SUBJECTIVE:  Michelle Mckinney is a 47 y.o. female who presents with a skin complaint.   Location: reports mild 'swelling of my face'; R-sided Onset: gradual; yesterday Pruritic? Yes; mild Painful? No Progression: stable  Drainage? No  Known trigger? No  New soaps/lotions/topicals/detergents/medications? No Environmental exposures or allergies? none Contacts with similar? No Recent travel? No  Other associated symptoms: none Therapies tried thus far: Benadryl with some relief Denies fever and myalgia. No specific aggravating or alleviating factors reported.   ROS: As per HPI.  OBJECTIVE: Vitals:   05/25/18 1916 05/25/18 1917  BP: 136/87   Pulse: 81   Resp: 17   Temp: 98.4 F (36.9 C)   TempSrc: Oral   SpO2: 99% 99%    General appearance: alert; no distress Lungs: clear to auscultation bilaterally Heart: regular rate and rhythm Extremities: no edema Skin: warm and dry; signs of infection: slight swelling of R cheek with minimal erythema and some warmth compared to the L cheek; no lip swelling or angioedema Psychological: alert and cooperative; normal mood and affect  No Known  Allergies  Past Medical History:  Diagnosis Date  . Diabetes mellitus without complication [facility](HCC)    Social History   Socioeconomic History  . Marital status: Single    Spouse name: Not on file  . Number of children: Not on file  . Years of education: Not on file  . Highest education level: Not on file  Occupational History  . Not on file  Social Needs  . Financial resource strain: Not on file  . Food insecurity:    Worry: Not on file    Inability: Not on file  . Transportation needs:    Medical: Not on file    Non-medical: Not on file  Tobacco Use  . Smoking status: Never Smoker  . Smokeless tobacco: Never Used  Substance and Sexual Activity  . Alcohol use: No  . Drug use: Never  . Sexual activity: Yes  Lifestyle  . Physical activity:    Days per week: Not on file    Minutes per session: Not on file  . Stress: Not on file  Relationships  . Social connections:    Talks on phone: Not on file    Gets together: Not on file    Attends religious service: Not on file    Active member of club or organization: Not on file    Attends meetings of clubs or organizations: Not on file    Relationship status: Not on file  . Intimate partner violence:    Fear of current or ex partner: Not on file    Emotionally abused: Not on file    Physically abused: Not on file  Forced sexual activity: Not on file  Other Topics Concern  . Not on file  Social History Narrative  . Not on file   Family History  Problem Relation Age of Onset  . Cancer Father   . Diabetes Sister   . Diabetes Maternal Aunt   . Breast cancer Maternal Aunt   . Hypertension Maternal Grandmother   . Stroke Maternal Grandfather   . Heart disease Paternal Grandfather    History reviewed. No pertinent surgical history.   Mardella Layman, MD 05/30/18 1118

## 2018-10-10 DIAGNOSIS — E1165 Type 2 diabetes mellitus with hyperglycemia: Secondary | ICD-10-CM | POA: Diagnosis not present

## 2018-10-10 DIAGNOSIS — E785 Hyperlipidemia, unspecified: Secondary | ICD-10-CM | POA: Diagnosis not present

## 2018-12-19 DIAGNOSIS — E119 Type 2 diabetes mellitus without complications: Secondary | ICD-10-CM | POA: Diagnosis not present

## 2018-12-25 ENCOUNTER — Other Ambulatory Visit: Payer: Self-pay | Admitting: Physician Assistant

## 2018-12-25 DIAGNOSIS — Z1231 Encounter for screening mammogram for malignant neoplasm of breast: Secondary | ICD-10-CM

## 2019-02-05 ENCOUNTER — Ambulatory Visit: Payer: BLUE CROSS/BLUE SHIELD

## 2019-02-23 DIAGNOSIS — L0291 Cutaneous abscess, unspecified: Secondary | ICD-10-CM | POA: Diagnosis not present

## 2019-02-23 DIAGNOSIS — R21 Rash and other nonspecific skin eruption: Secondary | ICD-10-CM | POA: Diagnosis not present

## 2019-03-26 ENCOUNTER — Ambulatory Visit: Payer: BLUE CROSS/BLUE SHIELD

## 2019-04-13 DIAGNOSIS — E1165 Type 2 diabetes mellitus with hyperglycemia: Secondary | ICD-10-CM | POA: Diagnosis not present

## 2019-04-13 DIAGNOSIS — E785 Hyperlipidemia, unspecified: Secondary | ICD-10-CM | POA: Diagnosis not present

## 2019-04-24 DIAGNOSIS — E785 Hyperlipidemia, unspecified: Secondary | ICD-10-CM | POA: Diagnosis not present

## 2019-04-24 DIAGNOSIS — E1165 Type 2 diabetes mellitus with hyperglycemia: Secondary | ICD-10-CM | POA: Diagnosis not present

## 2020-03-04 DIAGNOSIS — E119 Type 2 diabetes mellitus without complications: Secondary | ICD-10-CM | POA: Diagnosis not present

## 2020-07-21 DIAGNOSIS — E1165 Type 2 diabetes mellitus with hyperglycemia: Secondary | ICD-10-CM | POA: Diagnosis not present

## 2020-07-21 DIAGNOSIS — E785 Hyperlipidemia, unspecified: Secondary | ICD-10-CM | POA: Diagnosis not present

## 2020-10-15 DIAGNOSIS — Z20822 Contact with and (suspected) exposure to covid-19: Secondary | ICD-10-CM | POA: Diagnosis not present

## 2020-10-30 DIAGNOSIS — E1165 Type 2 diabetes mellitus with hyperglycemia: Secondary | ICD-10-CM | POA: Diagnosis not present

## 2020-10-30 DIAGNOSIS — M791 Myalgia, unspecified site: Secondary | ICD-10-CM | POA: Diagnosis not present

## 2020-10-30 DIAGNOSIS — E785 Hyperlipidemia, unspecified: Secondary | ICD-10-CM | POA: Diagnosis not present

## 2020-10-30 DIAGNOSIS — E669 Obesity, unspecified: Secondary | ICD-10-CM | POA: Diagnosis not present

## 2021-01-30 DIAGNOSIS — E785 Hyperlipidemia, unspecified: Secondary | ICD-10-CM | POA: Diagnosis not present

## 2021-01-30 DIAGNOSIS — E669 Obesity, unspecified: Secondary | ICD-10-CM | POA: Diagnosis not present

## 2021-01-30 DIAGNOSIS — M791 Myalgia, unspecified site: Secondary | ICD-10-CM | POA: Diagnosis not present

## 2021-01-30 DIAGNOSIS — E1165 Type 2 diabetes mellitus with hyperglycemia: Secondary | ICD-10-CM | POA: Diagnosis not present

## 2021-04-17 ENCOUNTER — Other Ambulatory Visit: Payer: Self-pay

## 2021-04-17 ENCOUNTER — Emergency Department (HOSPITAL_COMMUNITY)
Admission: EM | Admit: 2021-04-17 | Discharge: 2021-04-17 | Disposition: A | Discharge status: Home / Self Care | Payer: BC Managed Care – PPO | Attending: Emergency Medicine | Admitting: Emergency Medicine

## 2021-04-17 ENCOUNTER — Encounter (HOSPITAL_COMMUNITY): Payer: Self-pay | Admitting: Emergency Medicine

## 2021-04-17 DIAGNOSIS — Z7984 Long term (current) use of oral hypoglycemic drugs: Secondary | ICD-10-CM | POA: Diagnosis not present

## 2021-04-17 DIAGNOSIS — R519 Headache, unspecified: Secondary | ICD-10-CM | POA: Insufficient documentation

## 2021-04-17 DIAGNOSIS — Y92410 Unspecified street and highway as the place of occurrence of the external cause: Secondary | ICD-10-CM | POA: Diagnosis not present

## 2021-04-17 DIAGNOSIS — M542 Cervicalgia: Secondary | ICD-10-CM | POA: Diagnosis not present

## 2021-04-17 DIAGNOSIS — E119 Type 2 diabetes mellitus without complications: Secondary | ICD-10-CM | POA: Insufficient documentation

## 2021-04-17 MED ORDER — LIDOCAINE 4 % EX PTCH: 1.0000 | MEDICATED_PATCH | Freq: Two times a day (BID) | CUTANEOUS | 0 refills | Status: DC | PRN

## 2021-04-17 MED ORDER — METHOCARBAMOL 500 MG PO TABS: 500.0000 mg | ORAL_TABLET | Freq: Three times a day (TID) | ORAL | 0 refills | Status: AC | PRN
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
## 2021-05-12 DIAGNOSIS — E119 Type 2 diabetes mellitus without complications: Secondary | ICD-10-CM | POA: Diagnosis not present

## 2021-09-09 DIAGNOSIS — E669 Obesity, unspecified: Secondary | ICD-10-CM | POA: Diagnosis not present

## 2021-09-09 DIAGNOSIS — G729 Myopathy, unspecified: Secondary | ICD-10-CM | POA: Diagnosis not present

## 2021-09-09 DIAGNOSIS — E785 Hyperlipidemia, unspecified: Secondary | ICD-10-CM | POA: Diagnosis not present

## 2021-09-09 DIAGNOSIS — E1165 Type 2 diabetes mellitus with hyperglycemia: Secondary | ICD-10-CM | POA: Diagnosis not present

## 2021-09-15 ENCOUNTER — Other Ambulatory Visit: Payer: Self-pay | Admitting: Pediatrics

## 2021-09-15 ENCOUNTER — Other Ambulatory Visit: Payer: Self-pay | Admitting: Family Medicine

## 2021-09-15 DIAGNOSIS — Z1231 Encounter for screening mammogram for malignant neoplasm of breast: Secondary | ICD-10-CM

## 2021-10-07 ENCOUNTER — Other Ambulatory Visit: Payer: Self-pay | Admitting: Family Medicine

## 2021-10-07 DIAGNOSIS — N644 Mastodynia: Secondary | ICD-10-CM

## 2021-11-06 ENCOUNTER — Ambulatory Visit
Admission: RE | Admit: 2021-11-06 | Discharge: 2021-11-06 | Disposition: A | Discharge status: Home / Self Care | Payer: BC Managed Care – PPO | Source: Ambulatory Visit | Attending: Family Medicine | Admitting: Family Medicine

## 2021-11-06 ENCOUNTER — Ambulatory Visit: Payer: PRIVATE HEALTH INSURANCE

## 2021-11-06 DIAGNOSIS — R922 Inconclusive mammogram: Secondary | ICD-10-CM | POA: Diagnosis not present

## 2021-11-06 DIAGNOSIS — N644 Mastodynia: Secondary | ICD-10-CM

## 2021-11-10 ENCOUNTER — Other Ambulatory Visit: Payer: PRIVATE HEALTH INSURANCE

## 2021-11-10 DIAGNOSIS — Z23 Encounter for immunization: Secondary | ICD-10-CM | POA: Diagnosis not present

## 2021-12-21 DIAGNOSIS — E669 Obesity, unspecified: Secondary | ICD-10-CM | POA: Diagnosis not present

## 2021-12-21 DIAGNOSIS — I10 Essential (primary) hypertension: Secondary | ICD-10-CM | POA: Diagnosis not present

## 2021-12-21 DIAGNOSIS — E1165 Type 2 diabetes mellitus with hyperglycemia: Secondary | ICD-10-CM | POA: Diagnosis not present

## 2021-12-21 DIAGNOSIS — E785 Hyperlipidemia, unspecified: Secondary | ICD-10-CM | POA: Diagnosis not present

## 2022-01-04 ENCOUNTER — Other Ambulatory Visit: Payer: Self-pay

## 2022-01-04 ENCOUNTER — Encounter (HOSPITAL_COMMUNITY): Payer: Self-pay | Admitting: Emergency Medicine

## 2022-01-04 ENCOUNTER — Emergency Department (HOSPITAL_COMMUNITY)
Admission: EM | Admit: 2022-01-04 | Discharge: 2022-01-04 | Disposition: A | Discharge status: Home / Self Care | Payer: BC Managed Care – PPO | Attending: Emergency Medicine | Admitting: Emergency Medicine

## 2022-01-04 ENCOUNTER — Emergency Department (HOSPITAL_COMMUNITY): Payer: BC Managed Care – PPO

## 2022-01-04 DIAGNOSIS — R030 Elevated blood-pressure reading, without diagnosis of hypertension: Secondary | ICD-10-CM | POA: Diagnosis not present

## 2022-01-04 DIAGNOSIS — K760 Fatty (change of) liver, not elsewhere classified: Secondary | ICD-10-CM | POA: Diagnosis not present

## 2022-01-04 DIAGNOSIS — E119 Type 2 diabetes mellitus without complications: Secondary | ICD-10-CM | POA: Diagnosis not present

## 2022-01-04 DIAGNOSIS — M7918 Myalgia, other site: Secondary | ICD-10-CM

## 2022-01-04 DIAGNOSIS — Z7984 Long term (current) use of oral hypoglycemic drugs: Secondary | ICD-10-CM | POA: Insufficient documentation

## 2022-01-04 DIAGNOSIS — E871 Hypo-osmolality and hyponatremia: Secondary | ICD-10-CM | POA: Insufficient documentation

## 2022-01-04 DIAGNOSIS — R109 Unspecified abdominal pain: Secondary | ICD-10-CM | POA: Insufficient documentation

## 2022-01-04 LAB — COMPREHENSIVE METABOLIC PANEL
ALT: 22 U/L (ref 0–44)
AST: 24 U/L (ref 15–41)
Albumin: 3.9 g/dL (ref 3.5–5.0)
Alkaline Phosphatase: 63 U/L (ref 38–126)
Anion gap: 8 (ref 5–15)
BUN: 12 mg/dL (ref 6–20)
CO2: 25 mmol/L (ref 22–32)
Calcium: 8.9 mg/dL (ref 8.9–10.3)
Chloride: 98 mmol/L (ref 98–111)
Creatinine, Ser: 0.56 mg/dL (ref 0.44–1.00)
GFR, Estimated: >60 mL/min (ref >60)
Glucose, Bld: 303 mg/dL — ABNORMAL HIGH (ref 70–99)
Potassium: 3.9 mmol/L (ref 3.5–5.1)
Sodium: 131 mmol/L — ABNORMAL LOW (ref 135–145)
Total Bilirubin: 0.4 mg/dL (ref 0.3–1.2)
Total Protein: 7.8 g/dL (ref 6.5–8.1)

## 2022-01-04 LAB — URINALYSIS, ROUTINE W REFLEX MICROSCOPIC
Bilirubin Urine: NEGATIVE
Glucose, UA: >=500 mg/dL — AB
Ketones, ur: NEGATIVE mg/dL
Leukocytes,Ua: NEGATIVE
Nitrite: NEGATIVE
Protein, ur: 100 mg/dL — AB
Specific Gravity, Urine: 1.028 (ref 1.005–1.030)
pH: 5 (ref 5.0–8.0)

## 2022-01-04 LAB — LIPASE, BLOOD: Lipase: 45 U/L (ref 11–51)

## 2022-01-04 LAB — CBC
HCT: 37.6 % (ref 36.0–46.0)
Hemoglobin: 12.5 g/dL (ref 12.0–15.0)
MCH: 29.6 pg (ref 26.0–34.0)
MCHC: 33.2 g/dL (ref 30.0–36.0)
MCV: 88.9 fL (ref 80.0–100.0)
Platelets: 353 10*3/uL (ref 150–400)
RBC: 4.23 MIL/uL (ref 3.87–5.11)
RDW: 11.7 % (ref 11.5–15.5)
WBC: 6.7 10*3/uL (ref 4.0–10.5)
nRBC: 0 % (ref 0.0–0.2)

## 2022-01-04 LAB — I-STAT BETA HCG BLOOD, ED (MC, WL, AP ONLY): I-stat hCG, quantitative: <5 m[IU]/mL (ref <5)

## 2022-01-04 NOTE — ED Provider Notes (Signed)
?[facility] EMERGENCY DEPARTMENT ?Provider Note ? ? ?CSN: [id number] ?Arrival date & time: 01/04/22  0459 ? ?  ? ?History ? ?Chief Complaint  ?Patient presents with  ? Abdominal Pain  ? ? ?Michelle Mckinney is a 51 y.o. female. ? ?The history is provided by the patient.  ?Abdominal Pain ?She has history of diabetes and comes in complaining of pain in the right lateral abdomen for about the last 5 days, getting worse.  Pain is constant and worse when she is lying down.  It is not affected by movement.  She denies fever, chills, sweats.  She denies any nausea or vomiting.  She denies any urinary urgency, frequency, tenesmus, dysuria.  She has taken acetaminophen which gives her temporary relief.  She is concerned about possible appendicitis. ?  ?Home Medications ?Prior to Admission medications   ?Medication Sig Start Date End Date Taking? Authorizing Provider  ?BD PEN NEEDLE NANO U/F 32G X 4 MM MISC USE TO INJ VICTOZA ONCE A DAY SUBCUTANEOUS 90 DAYS 12/22/15   [provider]  ?glucose blood (ONE TOUCH ULTRA TEST) test strip 1 each by Other route as needed for other. Use as instructed    [provider]  ?Lidocaine (HM LIDOCAINE PATCH) 4 % PTCH Apply 1 patch topically every 12 (twelve) hours as needed. 04/17/21   Haskel Schroeder, PA-C  ?Liraglutide (VICTOZA ) Inject into the skin.    [provider]  ?metFORMIN (GLUCOPHAGE) 500 MG tablet Take by mouth daily with breakfast.    [provider]  ?methocarbamol (ROBAXIN) 500 MG tablet Take 1 tablet (500 mg total) by mouth every 8 (eight) hours as needed for muscle spasms. 04/17/21   Haskel Schroeder, PA-C  ?Multiple Vitamin (MULTIVITAMIN WITH MINERALS) TABS tablet Take 1 tablet by mouth daily.    [provider]  ?vitamin E 1000 UNIT capsule Take 1,000 Units by mouth daily.    [provider]  ?   ? ?Allergies    ?Patient has no known allergies.   ? ?Review of Systems   ?Review of Systems   ?Gastrointestinal:  Positive for abdominal pain.  ?All other systems reviewed and are negative. ? ?Physical Exam ?Updated Vital Signs ?BP (!) 185/89 (BP Location: Right Arm)   Pulse (!) 55   Temp 97.8 ?F (36.6 ?C) (Oral)   Resp 17   SpO2 98%  ?Physical Exam ?Vitals and nursing note reviewed.  ?51 year old female, resting comfortably and in no acute distress. Vital signs are significant for elevated blood pressure slightly slow heart rate. Oxygen saturation is 98%, which is normal. ?Head is normocephalic and atraumatic. PERRLA, EOMI. Oropharynx is clear. ?Neck is nontender and supple without adenopathy or JVD. ?Back is nontender and there is no CVA tenderness. ?Lungs are clear without rales, wheezes, or rhonchi. ?Chest is nontender. ?Heart has regular rate and rhythm without murmur. ?Abdomen is soft, flat, with mild tenderness rather well localized to the soft tissues just superior to the right lateral iliac crest in the mid-axillary line.  No other areas of tenderness are elicited. ?Extremities have no cyanosis or edema, full range of motion is present. ?Skin is warm and dry without rash. ?Neurologic: Mental status is normal, cranial nerves are intact, moves all extremities equally. ? ?ED Results / Procedures / Treatments   ?Labs ?(all labs ordered are listed, but only abnormal results are displayed) ?Labs Reviewed  ?URINALYSIS, ROUTINE W REFLEX MICROSCOPIC - Abnormal; Notable for the following components:  ?  Result Value  ? APPearance HAZY (*)   ? Glucose, UA >=500 (*)   ? Hgb urine dipstick LARGE (*)   ? Protein, ur 100 (*)   ? Bacteria, UA RARE (*)   ? All other components within normal limits  ?CBC  ?LIPASE, BLOOD  ?COMPREHENSIVE METABOLIC PANEL  ?I-STAT BETA HCG BLOOD, ED ([facility], [facility], [facility] ONLY)  ? ?Radiology ?CT Renal Stone Study ? ?Result Date: 01/04/2022 ?CLINICAL DATA:  51 year old female with history of flank pain. Suspected kidney stone. EXAM: CT ABDOMEN AND PELVIS WITHOUT CONTRAST TECHNIQUE:  Multidetector CT imaging of the abdomen and pelvis was performed following the standard protocol without IV contrast. RADIATION DOSE REDUCTION: This exam was performed according to the departmental dose-optimization program which includes automated exposure control, adjustment of the mA and/or kV according to patient size and/or use of iterative reconstruction technique. COMPARISON:  No priors. FINDINGS: Lower chest: Scattered areas of mild scarring in the lung bases. Hepatobiliary: Mild diffuse low attenuation throughout the hepatic parenchyma, indicative of a background of hepatic steatosis. No definite suspicious hepatic lesions are confidently identified on today's noncontrast CT examination. Unenhanced appearance of the gallbladder is normal. Pancreas: No definite pancreatic mass or peripancreatic fluid collections or inflammatory changes are noted on today's noncontrast CT examination. Spleen: Unremarkable. Adrenals/Urinary Tract: There are no abnormal calcifications within the collecting system of either kidney, along the course of either ureter, or within the lumen of the urinary bladder. No hydroureteronephrosis or perinephric stranding to suggest urinary tract obstruction at this time. The unenhanced appearance of the kidneys is unremarkable bilaterally. Unenhanced appearance of the urinary bladder is normal. Bilateral adrenal glands are normal in appearance. Stomach/Bowel: Unenhanced appearance of the stomach is normal. There is no pathologic dilatation of small bowel or colon. Normal appendix. Vascular/Lymphatic: No atherosclerotic calcifications are noted in the abdominal aorta or pelvic vasculature. No lymphadenopathy noted in the abdomen or pelvis. Reproductive: Unenhanced appearance of the uterus and ovaries is unremarkable. Other: No significant volume of ascites.  No pneumoperitoneum. Musculoskeletal: There are no aggressive appearing lytic or blastic lesions noted in the visualized portions of the  skeleton. IMPRESSION: 1. There are no acute findings noted in the abdomen or pelvis to account for the patient's symptoms. 2. Hepatic steatosis Electronically Signed   By: Vinnie Langton M.D.   On: 01/04/2022 06:50   ? ?Procedures ?Procedures  ? ? ?Medications Ordered in ED ?Medications - No data to display ? ?ED Course/ Medical Decision Making/ A&P ?  ?                        ?Medical Decision Making ?Amount and/or Complexity of Data Reviewed ?Labs: ordered. ?Radiology: ordered. ? ? ?Pain localized to the area superior to the lateral iliac crest.  Location of pain makes musculoskeletal pain more likely.  Doubt appendicitis.  Consider pyelonephritis or urolithiasis.  Doubt diverticulitis, pancreatitis, cholecystitis.  Doubt ovarian cyst.  Urinalysis shows no hematuria or pyuria.  WBC is normal, hemoglobin is normal.  Mild hyponatremia is present which is not felt to be clinically significant, glucose is moderately elevated.  Remainder of metabolic panel is normal.  We will send for CT of abdomen and pelvis to look for evidence of urolithiasis or appendicitis. ? ?CT scan shows no evidence of ureterolithiasis, appendicitis, diverticulitis.  No obvious cause for pain is seen.  I have independently viewed the images, and agree with the radiologist's interpretation.  Patient is advised of these findings.  Pain  definitely seems musculoskeletal in origin.  She is advised to apply ice, continue using acetaminophen as needed for pain, add in over-the-counter NSAIDs as needed.  Return precautions discussed. ? ?Final Clinical Impression(s) / ED Diagnoses ?Final diagnoses:  ?Pain in abdominal muscle of right flank  ?Hyponatremia  ?Elevated blood pressure reading without diagnosis of hypertension  ? ? ?Rx / DC Orders ?ED Discharge Orders   ? ? None  ? ?  ? ? ?  ?Delora Fuel, MD ?[id number] [phone] ? ?

## 2022-01-04 NOTE — Discharge Instructions (Addendum)
Apply ice for 30 minutes at a time, 4 times a day. ? ?You may continue taking acetaminophen as needed for pain.  If you need additional pain relief, you may add ibuprofen or naproxen.  When you combine acetaminophen with either ibuprofen or naproxen, you will get better pain relief and you get by taking either medication by itself. ? ?Your blood pressure was high today.  Please check your blood pressure several times over the next 1-2 weeks.  If it is staying consistently high, you may need to take medication to control it.  Inadequately controlled high blood pressure can lead to heart attacks, strokes, kidney failure. ?

## 2022-01-04 NOTE — ED Triage Notes (Signed)
Pt reported to ED with c/o right sided abdominal pain that has been ongoing this week but increased in severity last night and awakened pt from her sleep. Denies any nausea or vomiting, or urinary frequency urgency or burning.  ?

## 2022-01-04 NOTE — ED Notes (Signed)
Reviewed discharge instructions with patient. Follow-up care and pain management reviewed. Patient verbalized understanding. Patient A&Ox4, VSS, and ambulatory with steady gait upon discharge. 

## 2022-01-26 ENCOUNTER — Ambulatory Visit (INDEPENDENT_AMBULATORY_CARE_PROVIDER_SITE_OTHER): Payer: BC Managed Care – PPO | Admitting: Internal Medicine

## 2022-01-26 ENCOUNTER — Encounter: Payer: Self-pay | Admitting: Internal Medicine

## 2022-01-26 VITALS — BP 124/76 | HR 70 | Ht 67.5 in | Wt 206.8 lb

## 2022-01-26 DIAGNOSIS — R109 Unspecified abdominal pain: Secondary | ICD-10-CM | POA: Diagnosis not present

## 2022-01-26 DIAGNOSIS — G8929 Other chronic pain: Secondary | ICD-10-CM

## 2022-01-26 DIAGNOSIS — E1165 Type 2 diabetes mellitus with hyperglycemia: Secondary | ICD-10-CM | POA: Diagnosis not present

## 2022-01-26 DIAGNOSIS — Z794 Long term (current) use of insulin: Secondary | ICD-10-CM | POA: Diagnosis not present

## 2022-01-26 DIAGNOSIS — R739 Hyperglycemia, unspecified: Secondary | ICD-10-CM

## 2022-01-26 LAB — POCT GLUCOSE (DEVICE FOR HOME USE): Glucose Fasting, POC: 181 mg/dL — AB (ref 70–99)

## 2022-01-26 MED ORDER — GLIPIZIDE 5 MG PO TABS: 5.0000 mg | ORAL_TABLET | Freq: Two times a day (BID) | ORAL | 1 refills | Status: DC

## 2022-01-26 NOTE — Patient Instructions (Signed)
-   Increase Semglee to 32 units daily  ?- Start Glipizide 5 mg before Breakfast and supper ?- Continue Victoza 1.8 mg weekly  ? ? ? ?HOW TO TREAT LOW BLOOD SUGARS (Blood sugar LESS THAN 70 MG/DL) ?Please follow the RULE OF 15 for the treatment of hypoglycemia treatment (when your (blood sugars are less than 70 mg/dL)  ? ?STEP 1: Take 15 grams of carbohydrates when your blood sugar is low, which includes:  ?3-4 GLUCOSE TABS  OR ?3-4 OZ OF JUICE OR REGULAR SODA OR ?ONE TUBE OF GLUCOSE GEL   ? ?STEP 2: RECHECK blood sugar in 15 MINUTES ?STEP 3: If your blood sugar is still low at the 15 minute recheck --> then, go back to STEP 1 and treat AGAIN with another 15 grams of carbohydrates. ? ?

## 2022-01-26 NOTE — Progress Notes (Signed)
?Name: Michelle Mckinney  ?MRN/ DOB: [id number], 12-07-1970   ?Age/ Sex: 51 y.o., female   ? ?PCP: Sheliah Hatch, PA-C   ?Reason for Endocrinology Evaluation: Type 2 Diabetes Mellitus  ?   ?Date of Initial Endocrinology Visit: 01/26/2022   ? ? ?PATIENT IDENTIFIER: Michelle Mckinney is a 51 y.o. female with a past medical history of T2DM. The patient presented for initial endocrinology clinic visit on 01/26/2022 for consultative assistance with her diabetes management.  ? ? ?HPI: ?Michelle Mckinney was  ? ? ?Diagnosed with DM years ago  ?Prior Medications tried/Intolerance: INtolerant to Roslyn Smiling,- yeast infection  Metformin and ozempic  ?Currently checking blood sugars 2 x / day ?Hypoglycemia episodes : no                ?Hemoglobin A1c has ranged from 7.8% in 2017, peaking at 14.0 % yrs ago  ?Patient required assistance for hypoglycemia: no ?Patient has required hospitalization within the last 1 year from hyper or hypoglycemia: no but had recent ED visit for UTI  ? ?In terms of diet, the patient eats 2-3 meals a day. Snacks twice daily . Drinks occasional gingerale , drinks juices  ? ?Has noted right side abdominal pain, with tingling and back pain for the past month, worse when laying down, no nausea or vomiting  ? ? ? ?HOME DIABETES REGIMEN: ?Victoza 1.8 mg daily  ?Semglee 30 units daily  ? ? ?Statin: intolerant to statins and Zetia  ?ACE-I/ARB: yes  ?Prior Diabetic Education: yes  ? ? ?METER DOWNLOAD SUMMARY: Did not bring  ? ?DIABETIC COMPLICATIONS: ?Microvascular complications:  ? ?Denies: CKD, neuropathy, retinopathy  ?Last eye exam: Completed 2022 ? ?Macrovascular complications:  ? ?Denies: CAD, PVD, CVA ? ? ?PAST HISTORY: ?Past Medical History:  ?Past Medical History:  ?Diagnosis Date  ? Diabetes mellitus without complication (HCC)   ? ?Past Surgical History: No past surgical history on file.  ?Social History:  reports that she has never smoked. She has never used smokeless tobacco. She reports that she does not  drink alcohol and does not use drugs. ?Family History:  ?Family History  ?Problem Relation Age of Onset  ? Cancer Father   ? Diabetes Sister   ? Diabetes Maternal Aunt   ? Breast cancer Maternal Aunt   ? Hypertension Maternal Grandmother   ? Stroke Maternal Grandfather   ? Heart disease Paternal Grandfather   ? ? ? ?HOME MEDICATIONS: ?Allergies as of 01/26/2022   ? ?   Reactions  ? Dapagliflozin Other (See Comments)  ? Empagliflozin Other (See Comments)  ? Ezetimibe Other (See Comments)  ? Metformin Other (See Comments)  ? Semaglutide(0.25 Or 0.5mg -dos) Other (See Comments)  ? Statins Other (See Comments)  ? ?  ? ?  ?Medication List  ?  ? ?  ? Accurate as of January 26, 2022  4:53 PM. If you have any questions, ask your nurse or doctor.  ?  ?  ? ?  ? ?STOP taking these medications   ? ?ezetimibe 10 MG tablet ?Commonly known as: ZETIA ?Stopped by: Scarlette Shorts, MD ?  ?Insulin Glargine Solostar 100 UNIT/ML Solostar Pen ?Commonly known as: LANTUS ?Stopped by: Scarlette Shorts, MD ?  ?lidocaine 4 % ?Commonly known as: HM Lidocaine Patch ?Stopped by: Scarlette Shorts, MD ?  ?metFORMIN 500 MG tablet ?Commonly known as: GLUCOPHAGE ?Stopped by: Scarlette Shorts, MD ?  ?multivitamin capsule ?Stopped by: Scarlette Shorts, MD ?  ? ?  ? ?  TAKE these medications   ? ?BD Pen Needle Nano U/F 32G X 4 MM Misc ?Generic drug: Insulin Pen Needle ?USE TO INJ VICTOZA ONCE A DAY SUBCUTANEOUS 90 DAYS ?  ?Co Q 10 100 MG Caps ?See admin instructions. ?  ?Cranberry Soft 500 MG Chew ?See admin instructions. ?  ?Elderberry 575 MG/5ML Syrp ?See admin instructions. ?  ?glipiZIDE 5 MG tablet ?Commonly known as: GLUCOTROL ?Take 1 tablet (5 mg total) by mouth 2 (two) times daily before a meal. ?Started by: Scarlette Shorts, MD ?  ?glucose blood test strip ?1 each by Other route as needed for other. Use as instructed ?  ?losartan 25 MG tablet ?Commonly known as: COZAAR ?Take 25 mg by mouth daily. ?  ?methocarbamol 500 MG  tablet ?Commonly known as: ROBAXIN ?Take 1 tablet (500 mg total) by mouth every 8 (eight) hours as needed for muscle spasms. ?  ?multivitamin with minerals Tabs tablet ?Take 1 tablet by mouth daily. ?  ?Omega 3-6-9 Complex Caps ?See admin instructions. ?  ?Semglee (yfgn) 100 UNIT/ML Pen ?Generic drug: insulin glargine-yfgn ?Inject 30 Units into the skin daily. ?  ?VICTOZA [place] ?Inject 1.8 mg into the skin. ?  ?vitamin E 1000 UNIT capsule ?Take 1,000 Units by mouth daily. ?  ? ?  ? ? ? ?ALLERGIES: ?Allergies  ?Allergen Reactions  ? Dapagliflozin Other (See Comments)  ? Empagliflozin Other (See Comments)  ? Ezetimibe Other (See Comments)  ? Metformin Other (See Comments)  ? Semaglutide(0.25 Or 0.5mg -Dos) Other (See Comments)  ? Statins Other (See Comments)  ? ? ? ?REVIEW OF SYSTEMS: ?A comprehensive ROS was conducted with the patient and is negative except as per HPI and below:  ?Review of Systems  ?Gastrointestinal:  Positive for abdominal pain.  ?Neurological:  Positive for tingling.  ?     Right flank tingling and back pain   ? ?  ?OBJECTIVE:  ? ?VITAL SIGNS: BP 124/76 (BP Location: Left Arm, Patient Position: Sitting, Cuff Size: Large)   Pulse 70   Ht 5' 7.5" (1.715 m)   Wt 206 lb 12.8 oz (93.8 kg)   SpO2 99%   BMI 31.91 kg/m?   ? ?PHYSICAL EXAM:  ?General: Pt appears well and is in NAD  ?Neck: General: Supple without adenopathy or carotid bruits. ?Thyroid: Thyroid size normal.  No goiter or nodules appreciated. No thyroid bruit.  ?Lungs: Clear with good BS bilat with no rales, rhonchi, or wheezes  ?Heart: RRR with normal S1 and S2 and no gallops; no murmurs; no rub  ?Abdomen: Right upper and lower tenderness worse towards the right flank   ?Extremities:  ?Lower extremities - No pretibial edema. No lesions.  ?Neuro: MS is good with appropriate affect, pt is alert and Ox3  ? ? ?DM foot exam: 01/26/2022 ? ?The skin of the feet is intact without sores or ulcerations. ?The pedal pulses are 2+ on right and 2+ on  left. ?The sensation is intact to a screening 5.07, 10 gram monofilament bilaterally ? ? ?DATA REVIEWED: ? ?Lab Results  ?Component Value Date  ? HGBA1C 7.8 01/21/2016  ? ? Latest Reference Range & Units 01/04/22 05:13  ?Sodium 135 - 145 mmol/L 131 (L)  ?Potassium 3.5 - 5.1 mmol/L 3.9  ?Chloride 98 - 111 mmol/L 98  ?CO2 22 - 32 mmol/L 25  ?Glucose 70 - 99 mg/dL 628 (H)  ?BUN 6 - 20 mg/dL 12  ?Creatinine 0.44 - 1.00 mg/dL 3.15  ?Calcium 8.9 - 10.3 mg/dL 8.9  ?  Anion gap 5 - 15  8  ?Alkaline Phosphatase 38 - 126 U/L 63  ?Albumin 3.5 - 5.0 g/dL 3.9  ?Lipase 11 - 51 U/L 45  ?AST 15 - 41 U/L 24  ?ALT 0 - 44 U/L 22  ?Total Protein 6.5 - 8.1 g/dL 7.8  ? ? ?ASSESSMENT / PLAN / RECOMMENDATIONS:  ? ?1) Type 2 Diabetes Mellitus, Poorly controlled, Without complications - Most recent A1c of 11.3 %. Goal A1c < 7.0 %.   ? ?Plan: ?GENERAL: ?I have discussed with the patient the pathophysiology of diabetes. We went over the natural progression of the disease. We talked about both insulin resistance and insulin deficiency. We stressed the importance of lifestyle changes including diet and exercise. I explained the complications associated with diabetes including retinopathy, nephropathy, neuropathy as well as increased risk of cardiovascular disease. We went over the benefit seen with glycemic control.  ? ?I explained to the patient that diabetic patients are at higher than normal risk for amputations.  ?Pt advised to avoid sugar-sweetened beverage and snacks when possible  ?She is intolerant to Metfomrin ?Intolerant to [place] and Jardiance  ?Discussed adding SU, cautioned against hypoglycemia  ? ?MEDICATIONS: ?Increase Semglee to 32 units daily  ?- Start Glipizide 5 mg before Breakfast and supper ?- Continue Victoza 1.8 mg weekly  ? ?EDUCATION / INSTRUCTIONS: ?BG monitoring instructions: Patient is instructed to check her blood sugars 1 times a day, fasting . ?Call [facility] if: BG persistently < 70  ?I reviewed  the Rule of 15 for the treatment of hypoglycemia in detail with the patient. Literature supplied. ? ? ?2) Diabetic complications:  ?Eye: Does not have known diabetic retinopathy.  ?Neuro/ Feet: Does not have known

## 2022-01-27 DIAGNOSIS — E1165 Type 2 diabetes mellitus with hyperglycemia: Secondary | ICD-10-CM | POA: Insufficient documentation

## 2022-01-27 DIAGNOSIS — G8929 Other chronic pain: Secondary | ICD-10-CM | POA: Insufficient documentation

## 2022-01-27 MED ORDER — SEMGLEE (YFGN) 100 UNIT/ML ~~LOC~~ SOPN: 32.0000 [IU] | PEN_INJECTOR | Freq: Every day | SUBCUTANEOUS | 3 refills | Status: DC

## 2022-05-07 DIAGNOSIS — Z1211 Encounter for screening for malignant neoplasm of colon: Secondary | ICD-10-CM | POA: Diagnosis not present

## 2022-05-20 DIAGNOSIS — G729 Myopathy, unspecified: Secondary | ICD-10-CM | POA: Diagnosis not present

## 2022-05-20 DIAGNOSIS — I10 Essential (primary) hypertension: Secondary | ICD-10-CM | POA: Diagnosis not present

## 2022-05-20 DIAGNOSIS — M25511 Pain in right shoulder: Secondary | ICD-10-CM | POA: Diagnosis not present

## 2022-05-20 DIAGNOSIS — R1011 Right upper quadrant pain: Secondary | ICD-10-CM | POA: Diagnosis not present

## 2022-05-27 ENCOUNTER — Ambulatory Visit (INDEPENDENT_AMBULATORY_CARE_PROVIDER_SITE_OTHER): Payer: BC Managed Care – PPO | Admitting: Internal Medicine

## 2022-05-27 ENCOUNTER — Encounter: Payer: Self-pay | Admitting: Internal Medicine

## 2022-05-27 VITALS — BP 126/74 | HR 69 | Ht 67.5 in | Wt 200.0 lb

## 2022-05-27 DIAGNOSIS — Z794 Long term (current) use of insulin: Secondary | ICD-10-CM

## 2022-05-27 DIAGNOSIS — E1165 Type 2 diabetes mellitus with hyperglycemia: Secondary | ICD-10-CM | POA: Diagnosis not present

## 2022-05-27 LAB — POCT GLYCOSYLATED HEMOGLOBIN (HGB A1C): Hemoglobin A1C: 10.3 % — AB (ref 4.0–5.6)

## 2022-05-27 MED ORDER — VICTOZA 18 MG/3ML ~~LOC~~ SOPN: 1.8000 mg | PEN_INJECTOR | Freq: Every day | SUBCUTANEOUS | 3 refills | Status: DC

## 2022-05-27 MED ORDER — SEMGLEE (YFGN) 100 UNIT/ML ~~LOC~~ SOPN: 36.0000 [IU] | PEN_INJECTOR | Freq: Every day | SUBCUTANEOUS | 3 refills | Status: AC

## 2022-05-27 NOTE — Progress Notes (Signed)
Name: Michelle Mckinney  MRN/ DOB: [id number], 1971/05/24   Age/ Sex: 51 y.o., female    PCP: Verl Mckinney   Reason for Endocrinology Evaluation: Type 2 Diabetes Mellitus     Date of Initial Endocrinology Visit: 01/26/2022    PATIENT IDENTIFIER: Michelle Mckinney is a 51 y.o. female with a past medical history of T2DM. The patient presented for initial endocrinology clinic visit on 01/26/2022 for consultative assistance with her diabetes management.    HPI: Michelle Mckinney was    Diagnosed with DM years ago  Prior Medications tried/Intolerance: INtolerant to Roslyn Smiling,- yeast infection  Metformin and ozempic               Hemoglobin A1c has ranged from 7.8% in 2017, peaking at 14.0 % yrs ago    On her initial visit to our clinic she had an A1c of 11.3%, she was on Victoza and basal insulin, which we continued and started her on glipizide after discussing the high risk of hypoglycemia in combining insulin with SU.  SUBJECTIVE:   During the last visit (01/26/2022): A1c 11.3% started glipizide, continued Victoza and Semglee  Today (05/27/22): Ms. Rossi is here for a follow-up on diabetes management.  She checks her blood sugars 1 times daily The patient has not had hypoglycemic episodes since the last clinic visit. She started exercising and changing dietary habits  Right sided abdomen and should pain are improving, pending ultrasound through PCP   Has nausea around menstrual cycle  She has not been taking Glipizide as prescribed se forgets to take   HOME DIABETES REGIMEN: Glipizide 5 mg twice daily Victoza 1.8 mg daily  Semglee 32 units daily    Statin: intolerant to statins and Zetia  ACE-I/ARB: yes  Prior Diabetic Education: yes    METER DOWNLOAD SUMMARY: Did not bring      DIABETIC COMPLICATIONS: Microvascular complications:   Denies: CKD, neuropathy, retinopathy  Last eye exam: Completed 2022  Macrovascular complications:   Denies: CAD, PVD,  CVA   PAST HISTORY: Past Medical History:  Past Medical History:  Diagnosis Date   Diabetes mellitus without complication (HCC)    Past Surgical History: No past surgical history on file.  Social History:  reports that she has never smoked. She has never used smokeless tobacco. She reports that she does not drink alcohol and does not use drugs. Family History:  Family History  Problem Relation Age of Onset   Cancer Father    Diabetes Sister    Diabetes Maternal Aunt    Breast cancer Maternal Aunt    Hypertension Maternal Grandmother    Stroke Maternal Grandfather    Heart disease Paternal Grandfather      HOME MEDICATIONS: Allergies as of 05/27/2022       Reactions   Dapagliflozin Other (See Comments)   Empagliflozin Other (See Comments)   Ezetimibe Other (See Comments)   Metformin Other (See Comments)   Semaglutide(0.25 Or 0.5mg -dos) Other (See Comments)   Statins Other (See Comments)        Medication List        Accurate as of May 27, 2022  9:44 AM. If you have any questions, ask your nurse or doctor.          BD Pen Needle Nano U/F 32G X 4 MM Misc Generic drug: Insulin Pen Needle USE TO INJ VICTOZA ONCE A DAY SUBCUTANEOUS 90 DAYS   Co Q 10 100 MG Caps See admin instructions.   Cranberry  Soft 500 MG Chew See admin instructions.   Elderberry 575 MG/5ML Syrp See admin instructions.   glipiZIDE 5 MG tablet Commonly known as: GLUCOTROL Take 1 tablet (5 mg total) by mouth 2 (two) times daily before a meal.   glucose blood test strip 1 each by Other route as needed for other. Use as instructed   losartan 25 MG tablet Commonly known as: COZAAR Take 25 mg by mouth daily.   methocarbamol 500 MG tablet Commonly known as: ROBAXIN Take 1 tablet (500 mg total) by mouth every 8 (eight) hours as needed for muscle spasms.   multivitamin with minerals Tabs tablet Take 1 tablet by mouth daily.   Omega 3-6-9 Complex Caps See admin instructions.    Semglee (yfgn) 100 UNIT/ML Pen Generic drug: insulin glargine-yfgn Inject 32 Units into the skin daily.   VICTOZA [place] Inject 1.8 mg into the skin.   vitamin E 1000 UNIT capsule Take 1,000 Units by mouth daily.         ALLERGIES: Allergies  Allergen Reactions   Dapagliflozin Other (See Comments)   Empagliflozin Other (See Comments)   Ezetimibe Other (See Comments)   Metformin Other (See Comments)   Semaglutide(0.25 Or 0.5mg -Dos) Other (See Comments)   Statins Other (See Comments)     OBJECTIVE:   VITAL SIGNS: BP 126/74 (BP Location: Left Arm, Patient Position: Sitting, Cuff Size: Normal)   Pulse 69   Ht 5' 7.5" (1.715 m)   Wt 200 lb (90.7 kg)   SpO2 98%   BMI 30.86 kg/m    PHYSICAL EXAM:  General: Pt appears well and is in NAD  Neck: General: Supple without adenopathy or carotid bruits. Thyroid: Thyroid size normal.  No goiter or nodules appreciated.   Lungs: Clear with good BS bilat with no rales, rhonchi, or wheezes  Heart: RRR with normal S1 and S2 and no gallops; no murmurs; no rub  Abdomen: Right upper and lower tenderness worse towards the right flank   Extremities:  Lower extremities - No pretibial edema. No lesions.  Neuro: MS is good with appropriate affect, pt is alert and Ox3    DM foot exam: 01/26/2022  The skin of the feet is intact without sores or ulcerations. The pedal pulses are 2+ on right and 2+ on left. The sensation is intact to a screening 5.07, 10 gram monofilament bilaterally   DATA REVIEWED:  Lab Results  Component Value Date   HGBA1C 7.8 01/21/2016    Latest Reference Range & Units 01/04/22 05:13  Sodium 135 - 145 mmol/L 131 (L)  Potassium 3.5 - 5.1 mmol/L 3.9  Chloride 98 - 111 mmol/L 98  CO2 22 - 32 mmol/L 25  Glucose 70 - 99 mg/dL 742 (H)  BUN 6 - 20 mg/dL 12  Creatinine 5.95 - 6.38 mg/dL 7.56  Calcium 8.9 - 43.3 mg/dL 8.9  Anion gap 5 - 15  8  Alkaline Phosphatase 38 - 126 U/L 63  Albumin 3.5 - 5.0 g/dL 3.9  Lipase  11 - 51 U/L 45  AST 15 - 41 U/L 24  ALT 0 - 44 U/L 22  Total Protein 6.5 - 8.1 g/dL 7.8    ASSESSMENT / PLAN / RECOMMENDATIONS:   1) Type 2 Diabetes Mellitus, Poorly controlled, Without complications - Most recent A1c of 11.3 %. Goal A1c < 7.0 %.    A1c has trended down from 11.3% to 10.3% No glucose data today, patient advised to bring her meter in the future She does  admit to imperfect adherence to glipizide, I have advised the patient to use phone reminders to remind her to take glipizide, I would not change her dose at this time However increase her basal dose of insulin as she continues to have fasting BG's > 150 mg/DL She is intolerant to Metfomrin Intolerant to [place]  We again cautioned against hypoglycemia with a combination of insulin and glipizide, we also discussed that if this does not work we will have to switch to prandial insulin  MEDICATIONS: Increase Semglee to 36 units daily  Continue glipizide 5 mg before Breakfast and supper Continue Victoza 1.8 mg weekly   EDUCATION / INSTRUCTIONS: BG monitoring instructions: Patient is instructed to check her blood sugars 1 times a day, fasting . Call [facility] if: BG persistently < 70  I reviewed the Rule of 15 for the treatment of hypoglycemia in detail with the patient. Literature supplied.   2) Diabetic complications:  Eye: Does not have known diabetic retinopathy.  Neuro/ Feet: Does not have known diabetic peripheral neuropathy. Renal: Patient does not have known baseline CKD. She is  on an ACEI/ARB at present.    Follow-up in 4 months     Signed electronically by: Lyndle Herrlich, MD  [facility]  [facility] [address] [place] [place], [place] [address] Phone: [phone] FAX: [phone]   CC: Marvia Pickles [address] [place] [address] Phone: [phone]  Fax: [phone]    Return to Endocrinology  clinic as below: No future appointments.

## 2022-05-27 NOTE — Patient Instructions (Signed)
-   Increase Semglee to 36 units daily  - Continue  Glipizide 5 mg before Breakfast and supper - Continue Victoza 1.8 mg weekly     HOW TO TREAT LOW BLOOD SUGARS (Blood sugar LESS THAN 70 MG/DL) Please follow the RULE OF 15 for the treatment of hypoglycemia treatment (when your (blood sugars are less than 70 mg/dL)   STEP 1: Take 15 grams of carbohydrates when your blood sugar is low, which includes:  3-4 GLUCOSE TABS  OR 3-4 OZ OF JUICE OR REGULAR SODA OR ONE TUBE OF GLUCOSE GEL    STEP 2: RECHECK blood sugar in 15 MINUTES STEP 3: If your blood sugar is still low at the 15 minute recheck --> then, go back to STEP 1 and treat AGAIN with another 15 grams of carbohydrates.

## 2022-07-13 DIAGNOSIS — K760 Fatty (change of) liver, not elsewhere classified: Secondary | ICD-10-CM | POA: Diagnosis not present

## 2022-07-13 DIAGNOSIS — R109 Unspecified abdominal pain: Secondary | ICD-10-CM | POA: Diagnosis not present

## 2022-10-15 ENCOUNTER — Ambulatory Visit: Payer: BC Managed Care – PPO | Admitting: Internal Medicine

## 2022-11-17 ENCOUNTER — Telehealth: Payer: Self-pay

## 2022-11-17 NOTE — Telephone Encounter (Signed)
Per fax received from [employer] is not covered and alternatives are Ozempic, Rybelsus, Trulicity, Mounjaro, and Bydureon.

## 2022-11-18 MED ORDER — TIRZEPATIDE 5 MG/0.5ML ~~LOC~~ SOAJ: 5.0000 mg | SUBCUTANEOUS | 3 refills | Status: AC

## 2022-12-08 ENCOUNTER — Other Ambulatory Visit: Payer: Self-pay | Admitting: Family Medicine

## 2022-12-08 DIAGNOSIS — Z1231 Encounter for screening mammogram for malignant neoplasm of breast: Secondary | ICD-10-CM

## 2023-01-20 ENCOUNTER — Ambulatory Visit
Admission: RE | Admit: 2023-01-20 | Discharge: 2023-01-20 | Disposition: A | Discharge status: Home / Self Care | Payer: BC Managed Care – PPO | Source: Ambulatory Visit | Attending: Family Medicine | Admitting: Family Medicine

## 2023-01-20 DIAGNOSIS — Z1231 Encounter for screening mammogram for malignant neoplasm of breast: Secondary | ICD-10-CM

## 2023-01-27 ENCOUNTER — Other Ambulatory Visit (HOSPITAL_COMMUNITY): Payer: Self-pay

## 2023-01-27 ENCOUNTER — Telehealth: Payer: Self-pay

## 2023-01-27 NOTE — Telephone Encounter (Signed)
PA request received via CMM for Mounjaro /0.5ML pen-injectors  PA has been submitted to [facility] and is pending additional questions/determination  Key: [id number]

## 2023-02-08 NOTE — Telephone Encounter (Signed)
Insurance requested PA be resubmitted with documents supporting a diagnosis of type 2 diabetes.   PA resubmiited: 02/08/23 Key: [id number]  Status is pending

## 2023-02-10 NOTE — Telephone Encounter (Signed)
Pharmacy Patient Advocate Encounter  Received notification that the request for prior authorization for Michelle Mckinney has been denied due to:    Denial letter attached to pt chart

## 2023-05-12 ENCOUNTER — Encounter (HOSPITAL_COMMUNITY): Payer: Self-pay

## 2023-05-12 ENCOUNTER — Ambulatory Visit (HOSPITAL_COMMUNITY)
Admission: EM | Admit: 2023-05-12 | Discharge: 2023-05-12 | Disposition: A | Discharge status: Home / Self Care | Payer: BC Managed Care – PPO | Attending: Family Medicine | Admitting: Family Medicine

## 2023-05-12 DIAGNOSIS — Z1152 Encounter for screening for COVID-19: Secondary | ICD-10-CM | POA: Insufficient documentation

## 2023-05-12 DIAGNOSIS — I10 Essential (primary) hypertension: Secondary | ICD-10-CM | POA: Diagnosis not present

## 2023-05-12 DIAGNOSIS — B349 Viral infection, unspecified: Secondary | ICD-10-CM | POA: Insufficient documentation

## 2023-05-12 NOTE — ED Triage Notes (Signed)
Pt reports she has sneezing, head/chest congestions, headache, chills, lost of taste "ate first, and body aches x 3 days.

## 2023-05-12 NOTE — Discharge Instructions (Addendum)
Resume Losartan for blood pressure medications. Monitor your blood pressure at home, goal BP <140/80. Schedule a follow-up with your Primary care in 4 week for blood pressure follow-up.  Your COVID 19 results will be available in 24 hours. Negative results are immediately resulted to Mychart. Positive results will receive a follow-up call from our clinic. Continue with Mucinex for symptoms management.

## 2023-05-12 NOTE — ED Provider Notes (Signed)
[facility]    CSN: [id number] Arrival date & time: 05/12/23  1219      History   Chief Complaint Chief Complaint  Patient presents with   Covid Positive    HPI Michelle Mckinney is a 52 y.o. female.   HPI Patient presents today for evaluation of positive home COVID test.  Patient reports that she tested positive with 2 home COVID test 2 days ago and retested again yesterday.  She has had symptoms of headache, congestion, and had cough which has improved with over-the-counter medications.  Of note patient has had 3 blood pressure readings since being here in clinic and her blood pressure has been elevated.  Initially blood pressure reading was 182/107 on repeat second blood pressure was similar.  With manual check patient's blood pressure was 165/92.  Patient has been prescribed blood pressure medication in the past but was told it was not for hypertension was prescribed in order to protect her kidneys due to type 2 diabetes.  Patient reports she has the medication at home never started the medication because she did not have a history of hypertension.  She denies any other symptoms besides headache which she is uncertain if is it related to blood pressure or COVID-positive diagnosis.  She reports that she has continued to take diabetes medications however has not checked her blood sugar in the last few weeks.  Nuys any symptoms of polyuria, increased thirst, or increased appetite.  She reports no known fever however has had chills and sweats since the onset of symptoms.  All of all she has had symptoms for approximately 3 to 4 days, with most of her symptoms improving with exception of headache. Past Medical History:  Diagnosis Date   Diabetes mellitus without complication [facility])     Patient Active Problem List   Diagnosis Date Noted   Chronic right flank pain 01/27/2022   Type 2 diabetes mellitus with hyperglycemia, with long-term current use of insulin (HCC) 01/27/2022    Uncontrolled type 2 diabetes mellitus with hyperglycemia, without long-term current use of insulin (HCC) 01/21/2016    History reviewed. No pertinent surgical history.  OB History   No obstetric history on file.      Home Medications    Prior to Admission medications   Medication Sig Start Date End Date Taking? Authorizing Provider  BD PEN NEEDLE NANO U/F 32G X 4 MM MISC USE TO INJ VICTOZA ONCE A DAY SUBCUTANEOUS 90 DAYS 12/22/15   [provider]  Coenzyme Q10 (CO Q 10) 100 MG CAPS See admin instructions.    [provider]  Cranberry Soft 500 MG CHEW See admin instructions.    [provider]  Elderberry 575 MG/5ML SYRP See admin instructions.    [provider]  glipiZIDE (GLUCOTROL) 5 MG tablet Take 1 tablet (5 mg total) by mouth 2 (two) times daily before a meal. 01/26/22   Shamleffer, Konrad Dolores, MD  glucose blood test strip 1 each by Other route as needed for other. Use as instructed    [provider]  losartan (COZAAR) 25 MG tablet Take 25 mg by mouth daily. 09/09/21   [provider]  methocarbamol (ROBAXIN) 500 MG tablet Take 1 tablet (500 mg total) by mouth every 8 (eight) hours as needed for muscle spasms. 04/17/21   Haskel Schroeder, PA-C  Multiple Vitamin (MULTIVITAMIN WITH MINERALS) TABS tablet Take 1 tablet by mouth daily.    [provider]  Omega 3-6-9 Fatty Acids (  OMEGA 3-6-9 COMPLEX) CAPS See admin instructions.    [provider]  SEMGLEE, YFGN, 100 UNIT/ML Pen Inject 36 Units into the skin daily. 05/27/22   Shamleffer, Konrad Dolores, MD  tirzepatide [facility]) 5 MG/0.5ML Pen Inject 5 mg into the skin once a week. 11/18/22   Shamleffer, Konrad Dolores, MD  vitamin E 1000 UNIT capsule Take 1,000 Units by mouth daily.    [provider]    Family History Family History  Problem Relation Age of Onset   Cancer Father    Diabetes Sister    Diabetes Maternal Aunt    Breast cancer  Maternal Aunt    Hypertension Maternal Grandmother    Stroke Maternal Grandfather    Heart disease Paternal Grandfather     Social History Social History   Tobacco Use   Smoking status: Never   Smokeless tobacco: Never  Vaping Use   Vaping status: Never Used  Substance Use Topics   Alcohol use: No   Drug use: Never     Allergies   Dapagliflozin, Empagliflozin, Ezetimibe, Metformin, Semaglutide(0.25 or 0.5mg -dos), and Statins   Review of Systems Review of Systems Pertinent negatives listed in HPI   Physical Exam Triage Vital Signs ED Triage Vitals  Encounter Vitals Group     BP 05/12/23 1338 (!) 182/107     Systolic BP Percentile --      Diastolic BP Percentile --      Pulse Rate 05/12/23 1338 70     Resp 05/12/23 1338 20     Temp 05/12/23 1338 97.8 F (36.6 C)     Temp Source 05/12/23 1338 Oral     SpO2 05/12/23 1338 96 %     Weight --      Height --      Head Circumference --      Peak Flow --      Pain Score 05/12/23 1336 7     Pain Loc --      Pain Education --      Exclude from Growth Chart --    No data found.  Updated Vital Signs BP (!) 165/92 (BP Location: Left Arm) Comment (BP Location): performed by Jerrilyn Cairo, NP  Pulse 70   Temp 97.8 F (36.6 C) (Oral)   Resp 20   LMP 05/06/2023 (Exact Date) Comment: start  SpO2 96%   Visual Acuity Right Eye Distance:   Left Eye Distance:   Bilateral Distance:    Right Eye Near:   Left Eye Near:    Bilateral Near:     Physical Exam Vitals reviewed.  Constitutional:      Appearance: Normal appearance.  HENT:     Head: Normocephalic and atraumatic.     Nose: Congestion present. No rhinorrhea.     Mouth/Throat:     Pharynx: No oropharyngeal exudate.  Eyes:     Extraocular Movements: Extraocular movements intact.     Pupils: Pupils are equal, round, and reactive to light.  Cardiovascular:     Rate and Rhythm: Normal rate and regular rhythm.     Heart sounds: Normal heart sounds.  Pulmonary:      Effort: Pulmonary effort is normal.     Breath sounds: Normal breath sounds.  Musculoskeletal:     Cervical back: Normal range of motion and neck supple.     Right lower leg: No edema.     Left lower leg: No edema.  Skin:    General: Skin is warm and dry.  Neurological:  General: No focal deficit present.     Mental Status: She is alert and oriented to person, place, and time.     UC Treatments / Results  Labs (all labs ordered are listed, but only abnormal results are displayed) Labs Reviewed  SARS CORONAVIRUS 2 (TAT 6-24 HRS)    EKG   Radiology No results found.  Procedures Procedures (including critical care time)  Medications Ordered in UC Medications - No data to display  Initial Impression / Assessment and Plan / UC Course  I have reviewed the triage vital signs and the nursing notes.  Pertinent labs & imaging results that were available during my care of the patient were reviewed by me and considered in my medical decision making (see chart for details).   Counseled patient on the importance of taking blood pressure medication and monitoring blood pressure readings.  Patient endorses that she has a full prescription of losartan at home and will begin taking medication today.  Encouraged to keep a log of blood pressure readings and schedule follow-up with primary care provider within 4 weeks. Repeated COVID test as patient may need for work proof of active infection.  Also provided a work note.  Patient advised that positive and negative results will update to MyChart and our office will only contact her directly if results are abnormal. Final Clinical Impressions(s) / UC Diagnoses   Final diagnoses:  Essential hypertension  Encounter for screening for COVID-19  Viral illness     Discharge Instructions      Resume Losartan for blood pressure medications. Monitor your blood pressure at home, goal BP <140/80. Schedule a follow-up with your Primary  care in 4 week for blood pressure follow-up.  Your COVID 19 results will be available in 24 hours. Negative results are immediately resulted to Mychart. Positive results will receive a follow-up call from our clinic. Continue with Mucinex for symptoms management.     ED Prescriptions   None    PDMP not reviewed this encounter.   Bing Neighbors, NP 05/12/23 [phone]

## 2023-06-18 ENCOUNTER — Other Ambulatory Visit: Payer: Self-pay | Admitting: Internal Medicine

## 2023-06-21 ENCOUNTER — Ambulatory Visit (HOSPITAL_COMMUNITY)
Admission: EM | Admit: 2023-06-21 | Discharge: 2023-06-21 | Disposition: A | Discharge status: Home / Self Care | Payer: BC Managed Care – PPO | Attending: Family Medicine | Admitting: Family Medicine

## 2023-06-21 ENCOUNTER — Encounter (HOSPITAL_COMMUNITY): Payer: Self-pay

## 2023-06-21 DIAGNOSIS — L0231 Cutaneous abscess of buttock: Secondary | ICD-10-CM

## 2023-06-21 MED ORDER — FLUCONAZOLE 150 MG PO TABS: ORAL_TABLET | ORAL | 0 refills | Status: AC

## 2023-06-21 MED ORDER — LIDOCAINE-EPINEPHRINE 1 %-1:100000 IJ SOLN
INTRAMUSCULAR | Status: AC
  Filled 2023-06-21: qty 1

## 2023-06-21 MED ORDER — DOXYCYCLINE HYCLATE 100 MG PO CAPS: 100.0000 mg | ORAL_CAPSULE | Freq: Two times a day (BID) | ORAL | 0 refills | Status: AC

## 2023-06-21 MED ORDER — OXYCODONE-ACETAMINOPHEN 5-325 MG PO TABS: 0.5000 | ORAL_TABLET | Freq: Four times a day (QID) | ORAL | 0 refills | Status: AC | PRN

## 2023-06-21 NOTE — ED Triage Notes (Signed)
Patient here today with c/o abscess on right buttock since last week.

## 2023-06-21 NOTE — Discharge Instructions (Signed)
Be aware, you have been prescribed pain medications that may cause drowsiness. While taking this medication, do not take any other medications containing acetaminophen (Tylenol). Do not combine with alcohol or recreational drugs. Please do not drive, operate heavy machinery, or take part in activities that require making important decisions while on this medication as your judgement may be clouded.

## 2023-06-22 NOTE — ED Provider Notes (Signed)
[facility]   [id number] 06/21/23 Arrival Time: 1657  ASSESSMENT & PLAN:  1. Abscess of buttock, right     Incision and Drainage Procedure Note  Anesthesia: 1% lidocaine with epinephrine  Procedure Details  The procedure, risks and complications have been discussed in detail (including, but not limited to pain and bleeding) with the patient.  The skin induration over inferior R buttock was prepped and draped in the usual fashion. After adequate local anesthesia, I&D with a #11 blade was performed on the right inferior buttock with minimal purulent drainage. Wound explored for any loculations.  EBL: minimal Drains: none Packing: none Condition: Tolerated procedure well Complications: none.  Begin: Meds ordered this encounter  Medications   doxycycline (VIBRAMYCIN) 100 MG capsule    Sig: Take 1 capsule (100 mg total) by mouth 2 (two) times daily.    Dispense:  14 capsule    Refill:  0   oxyCODONE-acetaminophen (PERCOCET/ROXICET) 5-325 MG tablet    Sig: Take 0.5-1 tablets by mouth every 6 (six) hours as needed for severe pain.    Dispense:  4 tablet    Refill:  0   fluconazole (DIFLUCAN) 150 MG tablet    Sig: Take one tablet by mouth as a single dose. May repeat in 3 days if symptoms persist.    Dispense:  2 tablet    Refill:  0   Fluconazole Rx at pt request.   Follow-up Information     [facility].   Specialty: Urgent Care Why: If worsening or failing to improve as anticipated over the next 48 hours. Contact information: [address] [place] [address] [phone]                 Reviewed expectations re: course of current medical issues. Questions answered. Outlined signs and symptoms indicating need for more acute intervention. Patient verbalized understanding. After Visit Summary given.   SUBJECTIVE:  Michelle Mckinney is a 52 y.o. female who presents with a possible infection of her R  buttock. Onset gradual, approximately  a few  days ago without active drainage and without active bleeding. Symptoms have gradually worsened since beginning. Fever: absent. OTC/home treatment: none.   OBJECTIVE:  Vitals:   06/21/23 1841  BP: (!) 150/79  Pulse: 87  Resp: 16  Temp: 98.1 F (36.7 C)  TempSrc: Oral  SpO2: 97%  Weight: 93 kg  Height: 5' 7.5" (1.715 m)     General appearance: alert; no distress R inferior buttock (CMA female chaperone present): approx 1x1.5 cm induration is present; tender to touch; no active drainage or bleeding Psychological: alert and cooperative; normal mood and affect  Allergies  Allergen Reactions   Dapagliflozin Other (See Comments)   Empagliflozin Other (See Comments)   Ezetimibe Other (See Comments)   Metformin Other (See Comments)   Semaglutide(0.25 Or 0.5mg -Dos) Other (See Comments)   Statins Other (See Comments)    Past Medical History:  Diagnosis Date   Diabetes mellitus without complication (HCC)    Social History   Socioeconomic History   Marital status: Single    Spouse name: Not on file   Number of children: Not on file   Years of education: Not on file   Highest education level: Not on file  Occupational History   Not on file  Tobacco Use   Smoking status: Never   Smokeless tobacco: Never  Vaping Use   Vaping status: Never Used  Substance and Sexual Activity  Alcohol use: No   Drug use: Never   Sexual activity: Yes  Other Topics Concern   Not on file  Social History Narrative   Not on file   Social Determinants of Health   Financial Resource Strain: Not on file  Food Insecurity: Not on file  Transportation Needs: Not on file  Physical Activity: Not on file  Stress: Not on file  Social Connections: Unknown (06/22/2022)   Received from [facility]   Social Network    Social Network: Not on file   Family History  Problem Relation Age of Onset   Cancer Father    Diabetes Sister     Diabetes Maternal Aunt    Breast cancer Maternal Aunt    Hypertension Maternal Grandmother    Stroke Maternal Grandfather    Heart disease Paternal Grandfather    History reviewed. No pertinent surgical history.          Mardella Layman, MD 06/22/23 1045

## 2023-10-03 ENCOUNTER — Emergency Department (HOSPITAL_COMMUNITY): Payer: BC Managed Care – PPO

## 2023-10-03 ENCOUNTER — Emergency Department (HOSPITAL_COMMUNITY)
Admission: EM | Admit: 2023-10-03 | Discharge: 2023-10-04 | Disposition: A | Discharge status: Home / Self Care | Payer: BC Managed Care – PPO | Attending: Emergency Medicine | Admitting: Emergency Medicine

## 2023-10-03 DIAGNOSIS — Z794 Long term (current) use of insulin: Secondary | ICD-10-CM | POA: Diagnosis not present

## 2023-10-03 DIAGNOSIS — E871 Hypo-osmolality and hyponatremia: Secondary | ICD-10-CM | POA: Insufficient documentation

## 2023-10-03 DIAGNOSIS — Z79899 Other long term (current) drug therapy: Secondary | ICD-10-CM | POA: Diagnosis not present

## 2023-10-03 DIAGNOSIS — I10 Essential (primary) hypertension: Secondary | ICD-10-CM | POA: Insufficient documentation

## 2023-10-03 DIAGNOSIS — R519 Headache, unspecified: Secondary | ICD-10-CM | POA: Diagnosis not present

## 2023-10-03 DIAGNOSIS — R110 Nausea: Secondary | ICD-10-CM | POA: Diagnosis not present

## 2023-10-03 DIAGNOSIS — E1165 Type 2 diabetes mellitus with hyperglycemia: Secondary | ICD-10-CM | POA: Insufficient documentation

## 2023-10-03 LAB — COMPREHENSIVE METABOLIC PANEL
ALT: 18 U/L (ref 0–44)
AST: 19 U/L (ref 15–41)
Albumin: 3.7 g/dL (ref 3.5–5.0)
Alkaline Phosphatase: 67 U/L (ref 38–126)
Anion gap: 11 (ref 5–15)
BUN: 13 mg/dL (ref 6–20)
CO2: 26 mmol/L (ref 22–32)
Calcium: 9.4 mg/dL (ref 8.9–10.3)
Chloride: 94 mmol/L — ABNORMAL LOW (ref 98–111)
Creatinine, Ser: 0.66 mg/dL (ref 0.44–1.00)
GFR, Estimated: >60 mL/min (ref >60)
Glucose, Bld: 309 mg/dL — ABNORMAL HIGH (ref 70–99)
Potassium: 3.7 mmol/L (ref 3.5–5.1)
Sodium: 131 mmol/L — ABNORMAL LOW (ref 135–145)
Total Bilirubin: 0.7 mg/dL (ref 0.0–1.2)
Total Protein: 7.6 g/dL (ref 6.5–8.1)

## 2023-10-03 LAB — URINALYSIS, ROUTINE W REFLEX MICROSCOPIC
Bacteria, UA: NONE SEEN
Bilirubin Urine: NEGATIVE
Glucose, UA: >=500 mg/dL — AB
Hgb urine dipstick: NEGATIVE
Ketones, ur: 20 mg/dL — AB
Leukocytes,Ua: NEGATIVE
Nitrite: NEGATIVE
Protein, ur: >=300 mg/dL — AB
Specific Gravity, Urine: 1.015 (ref 1.005–1.030)
pH: 5 (ref 5.0–8.0)

## 2023-10-03 LAB — CBG MONITORING, ED: Glucose-Capillary: 319 mg/dL — ABNORMAL HIGH (ref 70–99)

## 2023-10-03 LAB — CBC
HCT: 39.8 % (ref 36.0–46.0)
Hemoglobin: 13.6 g/dL (ref 12.0–15.0)
MCH: 29.4 pg (ref 26.0–34.0)
MCHC: 34.2 g/dL (ref 30.0–36.0)
MCV: 86.1 fL (ref 80.0–100.0)
Platelets: 253 10*3/uL (ref 150–400)
RBC: 4.62 MIL/uL (ref 3.87–5.11)
RDW: 11.8 % (ref 11.5–15.5)
WBC: 6.7 10*3/uL (ref 4.0–10.5)
nRBC: 0 % (ref 0.0–0.2)

## 2023-10-03 LAB — HCG, SERUM, QUALITATIVE: Preg, Serum: NEGATIVE

## 2023-10-03 MED ORDER — ONDANSETRON 4 MG PO TBDP
4.0000 mg | ORAL_TABLET | Freq: Once | ORAL | Status: AC | PRN
  Administered 2023-10-03: 4 mg via ORAL
  Filled 2023-10-03: qty 1

## 2023-10-03 NOTE — ED Notes (Signed)
Brooke, PA came to see all clear for CVA s/s

## 2023-10-03 NOTE — ED Triage Notes (Addendum)
Pt had HA when she woke up at 10 this morning and went to sleep at 2100 last night.  Pt then began getting nauseated with dizziness at 1600.  Pt has not vomited.  Pt noticed BP high at home 183/92. Pt took meds as usual. Right leg feels heavy

## 2023-10-03 NOTE — ED Provider Triage Note (Signed)
Emergency Medicine Provider Triage Evaluation Note  Michelle Mckinney , a 52 y.o. female  was evaluated in triage.  Pt complains of headache since 10AM. Started feeling off balance at 4PM, but has been intermittent. HA is front. No weakness, vision changes, or difficulty swallowing.   Review of Systems  Positive: headache Negative: weakness  Physical Exam  BP (!) 195/87 (BP Location: Right Arm)   Pulse 64   Temp 98 F (36.7 C)   Resp 18   Ht 5' 7.5" (1.715 m)   Wt 93 kg   LMP 08/25/2023   SpO2 94%   BMI 31.64 kg/m  Gen:   Awake, no distress   Resp:  Normal effort  MSK:   Moves extremities without difficulty  Other:  No drift of BUE and BLE. No neurodeficits. Good strength.  Medical Decision Making  Medically screening exam initiated at 7:52 PM.  Appropriate orders placed.  Michelle Mckinney was informed that the remainder of the evaluation will be completed by another provider, this initial triage assessment does not replace that evaluation, and the importance of remaining in the ED until their evaluation is complete.    Michelle Mckinney, [place] 10/03/23 [phone]

## 2023-10-04 NOTE — ED Provider Notes (Signed)
 [facility] Provider Note   CSN: [id number] Arrival date & time: 10/03/23  1848     History  Chief Complaint  Patient presents with   Nausea    Michelle Mckinney is a 52 y.o. female with history of hypertension, diabetes, presents with concern for an elevated blood pressure reading Michelle Mckinney noticed last night.  States the reading had a systolic of about 200.  Michelle Mckinney also had a headache last night that has since resolved without intervention.  Michelle Mckinney does take 25 mg losartan daily and reports no changes to this medication.  Also reports Michelle Mckinney feels like her right leg has been heavier than normal over the past couple weeks, and Michelle Mckinney received a lymphatic massage which helped.  HPI     Home Medications Prior to Admission medications   Medication Sig Start Date End Date Taking? Authorizing Provider  BD PEN NEEDLE NANO U/F 32G X 4 MM MISC USE TO INJ VICTOZA  ONCE A DAY SUBCUTANEOUS 90 DAYS 12/22/15   [provider]  Coenzyme Q10 (CO Q 10) 100 MG CAPS See admin instructions.    [provider]  Cranberry Soft 500 MG CHEW See admin instructions.    [provider]  doxycycline  (VIBRAMYCIN ) 100 MG capsule Take 1 capsule (100 mg total) by mouth 2 (two) times daily. 06/21/23   Rolinda Rogue, MD  Elderberry 575 MG/5ML SYRP See admin instructions.    [provider]  fluconazole  (DIFLUCAN ) 150 MG tablet Take one tablet by mouth as a single dose. May repeat in 3 days if symptoms persist. 06/21/23   Rolinda Rogue, MD  glucose blood test strip 1 each by Other route as needed for other. Use as instructed    [provider]  losartan (COZAAR) 25 MG tablet Take 25 mg by mouth daily. 09/09/21   [provider]  magnesium gluconate (MAGONATE) 500 MG tablet Take 500 mg by mouth 2 (two) times daily.    [provider]  methocarbamol  (ROBAXIN ) 500 MG tablet Take 1 tablet (500 mg total) by mouth every 8 (eight) hours as  needed for muscle spasms. 04/17/21   Badalamente, Peter R, PA-C  Multiple Vitamin (MULTIVITAMIN WITH MINERALS) TABS tablet Take 1 tablet by mouth daily.    [provider]  Omega 3-6-9 Fatty Acids (OMEGA 3-6-9 COMPLEX) CAPS See admin instructions.    [provider]  oxyCODONE -acetaminophen  (PERCOCET/ROXICET) 5-325 MG tablet Take 0.5-1 tablets by mouth every 6 (six) hours as needed for severe pain. 06/21/23   Rolinda Rogue, MD  SEMGLEE , YFGN, 100 UNIT/ML Pen Inject 36 Units into the skin daily. 05/27/22   Shamleffer, Ibtehal Jaralla, MD  tirzepatide  (MOUNJARO ) 5 MG/0.5ML Pen Inject 5 mg into the skin once a week. 11/18/22   Shamleffer, Ibtehal Jaralla, MD      Allergies    Dapagliflozin, Empagliflozin, Ezetimibe, Metformin, Semaglutide(0.25 or 0.5mg -dos), and Statins    Review of Systems   Review of Systems  Respiratory:  Negative for shortness of breath.   Cardiovascular:  Negative for chest pain.  Neurological:  Positive for headaches. Negative for numbness.    Physical Exam Updated Vital Signs BP (!) 152/83   Pulse 81   Temp 98.4 F (36.9 C)   Resp 16   Ht 5' 7.5 (1.715 m)   Wt 93 kg   LMP 08/25/2023   SpO2 96%   BMI 31.64 kg/m  Physical Exam Vitals and nursing note reviewed.  Constitutional:  General: Michelle Mckinney is not in acute distress.    Appearance: Normal appearance. Michelle Mckinney is well-developed.  HENT:     Head: Normocephalic and atraumatic.  Eyes:     Conjunctiva/sclera: Conjunctivae normal.  Cardiovascular:     Rate and Rhythm: Normal rate and regular rhythm.     Heart sounds: No murmur heard.    Comments: 2+ radial pulses bilaterally Pulmonary:     Effort: Pulmonary effort is normal. No respiratory distress.     Breath sounds: Normal breath sounds.  Abdominal:     Palpations: Abdomen is soft.     Tenderness: There is no abdominal tenderness.  Musculoskeletal:        General: No swelling.     Cervical back: Neck supple.     Comments: No right or  left lower extremity edema  Skin:    General: Skin is warm and dry.     Capillary Refill: Capillary refill takes less than 2 seconds.  Neurological:     General: No focal deficit present.     Mental Status: Michelle Mckinney is alert.  Psychiatric:        Mood and Affect: Mood normal.        Behavior: Behavior normal.     ED Results / Procedures / Treatments   Labs (all labs ordered are listed, but only abnormal results are displayed) Labs Reviewed  COMPREHENSIVE METABOLIC PANEL - Abnormal; Notable for the following components:      Result Value   Sodium 131 (*)    Chloride 94 (*)    Glucose, Bld 309 (*)    All other components within normal limits  URINALYSIS, ROUTINE W REFLEX MICROSCOPIC - Abnormal; Notable for the following components:   APPearance HAZY (*)    Glucose, UA >=500 (*)    Ketones, ur 20 (*)    Protein, ur >=300 (*)    All other components within normal limits  CBG MONITORING, ED - Abnormal; Notable for the following components:   Glucose-Capillary 319 (*)    All other components within normal limits  CBC  HCG, SERUM, QUALITATIVE    EKG None  Radiology CT Head Wo Contrast Result Date: 10/04/2023 CLINICAL DATA:  Headache EXAM: CT HEAD WITHOUT CONTRAST TECHNIQUE: Contiguous axial images were obtained from the base of the skull through the vertex without intravenous contrast. RADIATION DOSE REDUCTION: This exam was performed according to the departmental dose-optimization program which includes automated exposure control, adjustment of the mA and/or kV according to patient size and/or use of iterative reconstruction technique. COMPARISON:  None Available. FINDINGS: Brain: No mass,hemorrhage or extra-axial collection. Normal appearance of the parenchyma and CSF spaces. Vascular: No hyperdense vessel or unexpected vascular calcification. Skull: The visualized skull base, calvarium and extracranial soft tissues are normal. Sinuses/Orbits: No fluid levels or advanced mucosal  thickening of the visualized paranasal sinuses. No mastoid or middle ear effusion. Normal orbits. Other: None. IMPRESSION: Normal head CT. Electronically Signed   By: Franky Stanford M.D.   On: 10/04/2023 00:34    Procedures Procedures    Medications Ordered in ED Medications  ondansetron  (ZOFRAN -ODT) disintegrating tablet 4 mg (4 mg Oral Given 10/03/23 1950)    ED Course/ Medical Decision Making/ A&P                                 Medical Decision Making Amount and/or Complexity of Data Reviewed Labs: ordered.  Risk Prescription drug management.  Differential diagnosis includes but is not limited to Hypertension, hypertensive emergency, headache, migraine, intracranial hemorrhage, intracranial mass  ED Course:  Patient well-appearing, stable vital signs aside from a elevated blood pressure of 152/83.  Last night Michelle Mckinney reports her blood pressure at home had a systolic of 200, but since being in the ER Michelle Mckinney has remained in the 150s over 80s.  Michelle Mckinney reports the headache Michelle Mckinney had last night resolved without intervention.  Feels at her baseline currently. I personally reviewed and interpreted lab and imaging findings which include: CBC within normal limits.  CMP with hyponatremia at 131, elevated glucose at 309.  Urinalysis reveals proteinuria and ketones, no signs of infection.  Serum pregnancy negative. CT head unremarkable. EKG with normal sinus rhythm.  Patient was also evaluated by Dr. Levander.  Given her headache from last night have resolved and blood pressures not as elevated, we feel Michelle Mckinney can follow-up with her PCP for further blood pressure and blood sugar management. No concern for any emergent conditions at this time. Patient stable and appropriate for discharge home at this time.  Impression: Hypertension Hyperglycemia  Disposition:  The patient was discharged home with instructions to take daily blood pressure readings and follow-up with her PCP within the next month for  further management of blood pressure and blood sugars. Return precautions given.               Final Clinical Impression(s) / ED Diagnoses Final diagnoses:  Hypertension, unspecified type    Rx / DC Orders ED Discharge Orders     None         Veta Palma, PA-C 10/04/23 9286    Levander Houston, MD 10/04/23 [phone]

## 2023-10-04 NOTE — Discharge Instructions (Addendum)
 You had elevated blood pressure readings here today.  Your blood sugars were also high.  Please follow-up with your PCP within the next month to discuss further blood pressure and blood sugar management.  Please continue taking your blood pressure medication (losartan) at home as prescribed.   Take your blood pressures at the same time every day and record these measurements.  Bring your blood pressure log into your PCP appointment with you.  Return to the ER for any chest pain, shortness of breath, severe headache that will not resolve with medication, any other new or concerning symptoms.

## 2023-10-09 DIAGNOSIS — G4486 Cervicogenic headache: Secondary | ICD-10-CM | POA: Diagnosis not present

## 2023-10-09 DIAGNOSIS — I10 Essential (primary) hypertension: Secondary | ICD-10-CM | POA: Diagnosis not present

## 2023-10-10 DIAGNOSIS — I10 Essential (primary) hypertension: Secondary | ICD-10-CM | POA: Diagnosis not present

## 2023-11-29 IMAGING — CT CT RENAL STONE PROTOCOL
2 of 3 series · 16 of 46 positions shown, 18 images · non-contrast
Comparison: No priors.

CLINICAL DATA: 50-year-old female with history of flank pain.
Suspected kidney stone.



[Series 3: renal stone 5.0 · axial · 0.98mm/px · z∈[+664,+1079]mm · 13 of 97 slices shown, 15 images]
[im 7/97  soft-tissue]
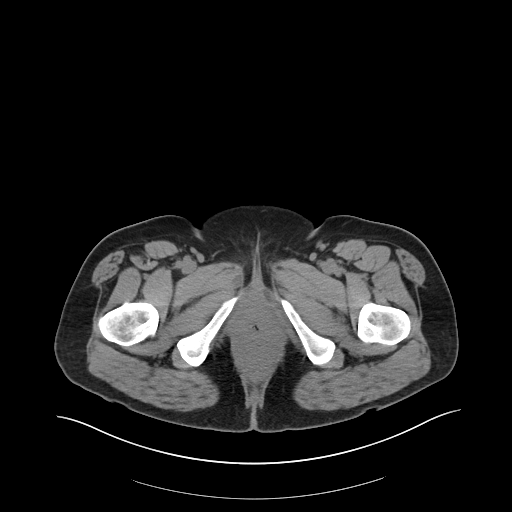
[im 7/97  bone]
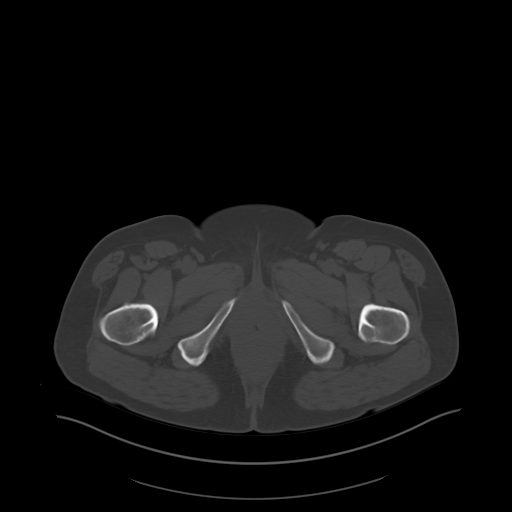
[im 13/97  soft-tissue]
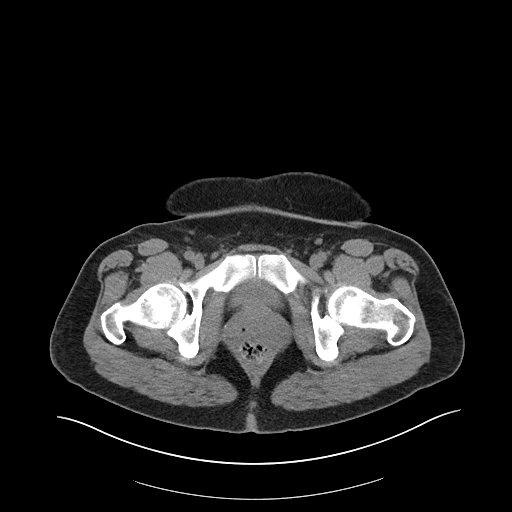
[im 19/97  soft-tissue]
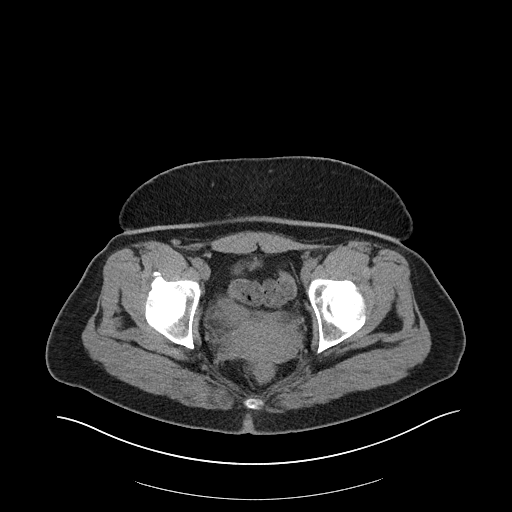
[im 28/97  soft-tissue]
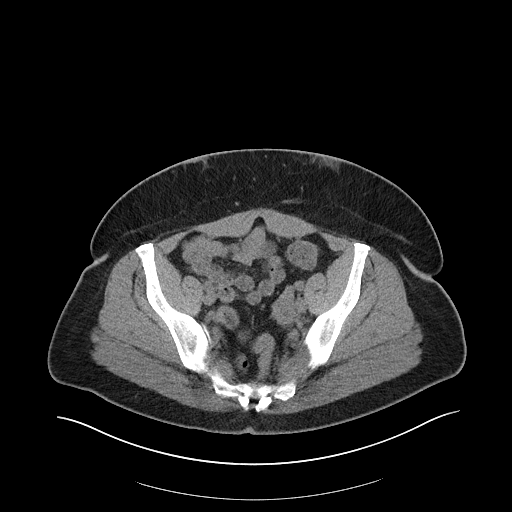
[im 35/97  soft-tissue]
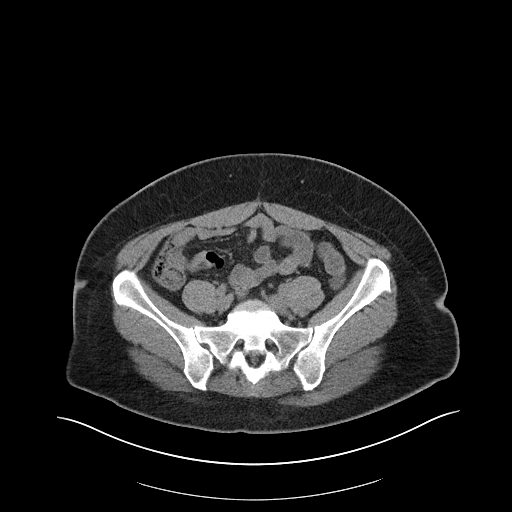
[im 41/97  soft-tissue]
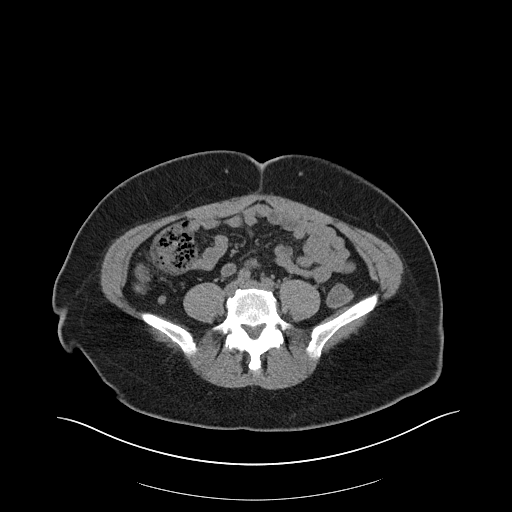
[im 50/97  soft-tissue]
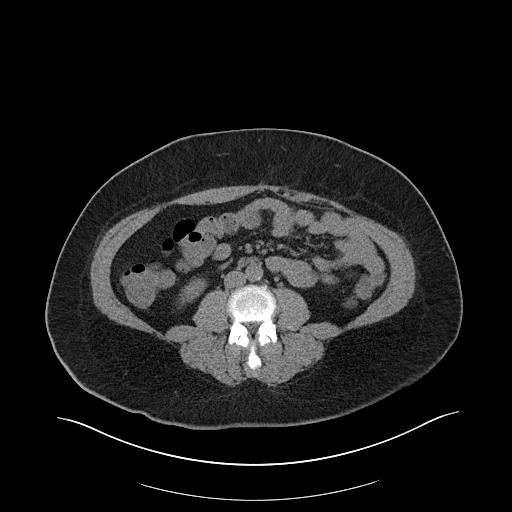
[im 56/97  soft-tissue]
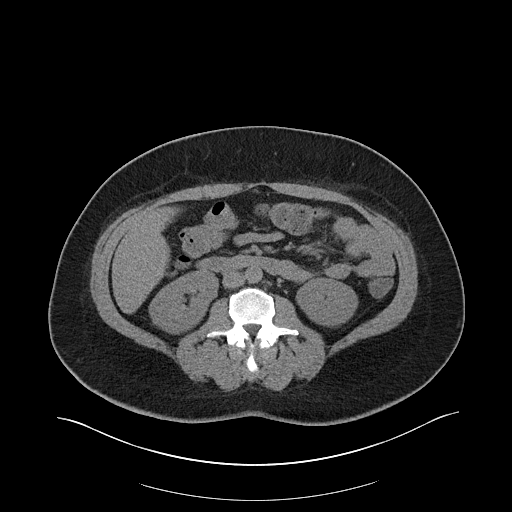
[im 62/97  soft-tissue]
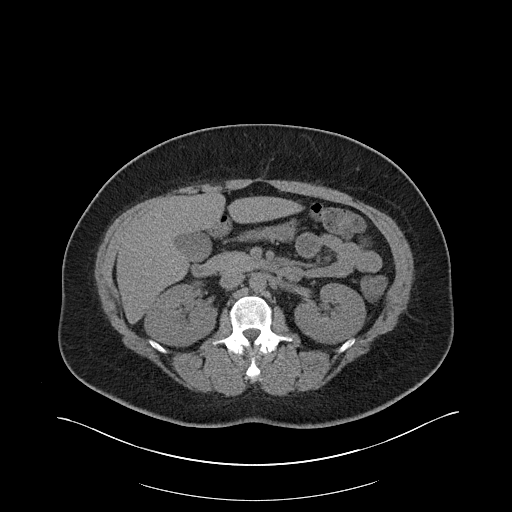
[im 62/97  bone]
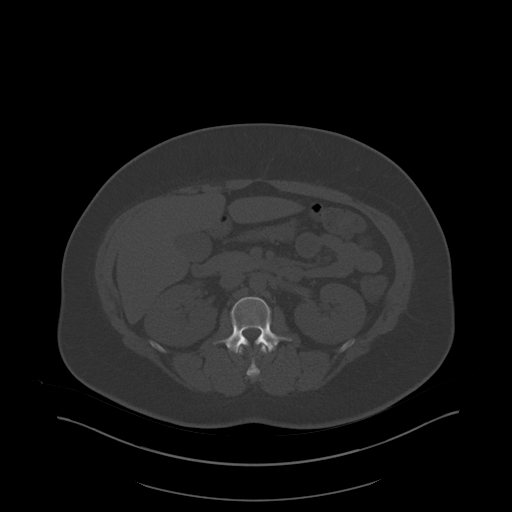
[im 69/97  soft-tissue]
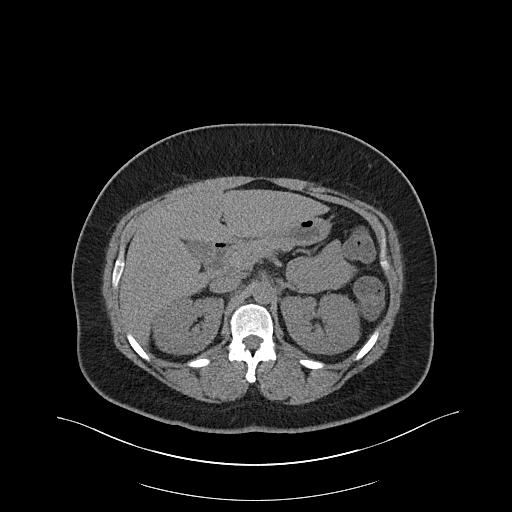
[im 78/97  soft-tissue]
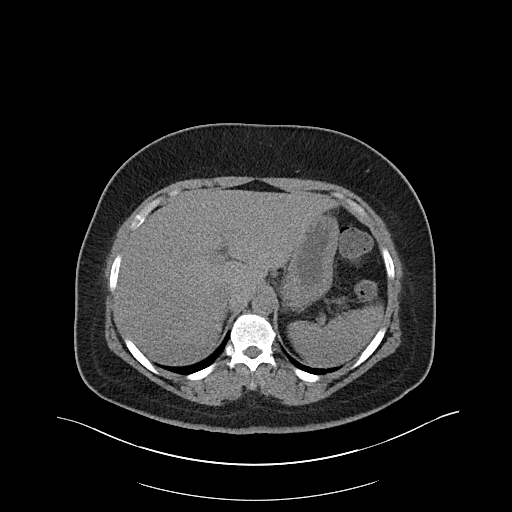
[im 84/97  soft-tissue]
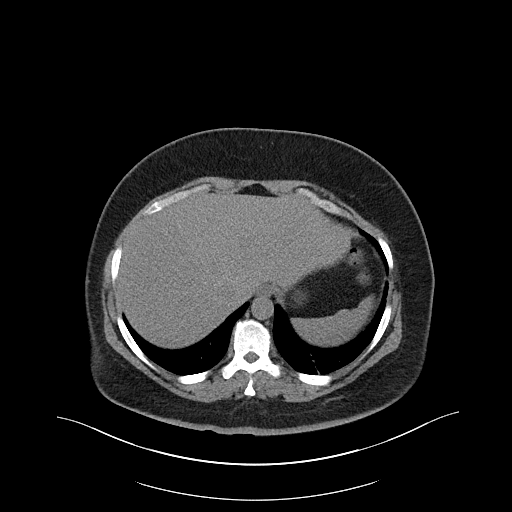
[im 90/97  soft-tissue]
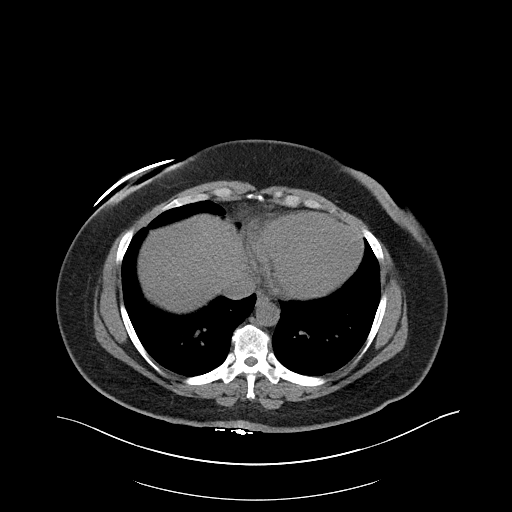

[Series 6: cor · coronal · 0.95mm/px · 3 of 162 slices shown]
[im 54/162  soft-tissue]
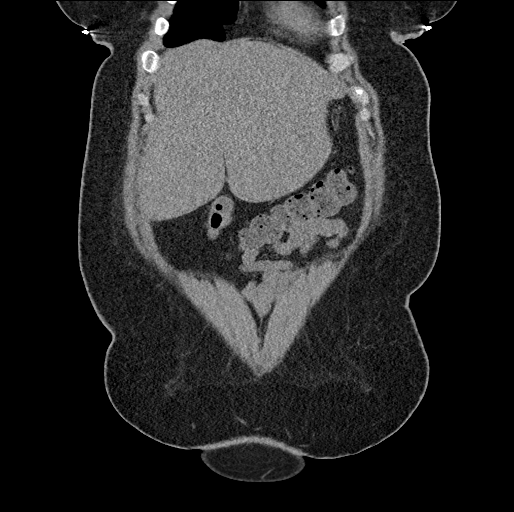
[im 72/162  soft-tissue]
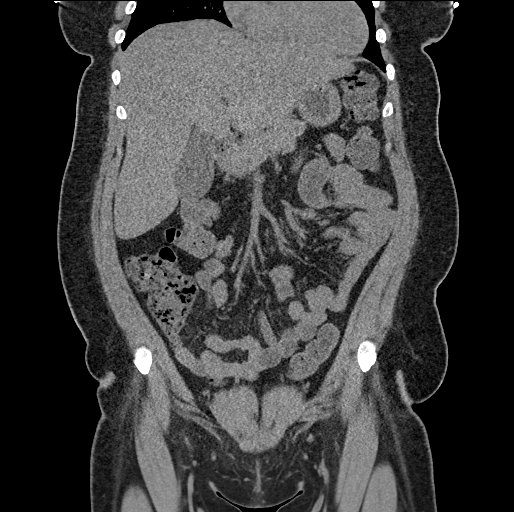
[im 90/162  soft-tissue]
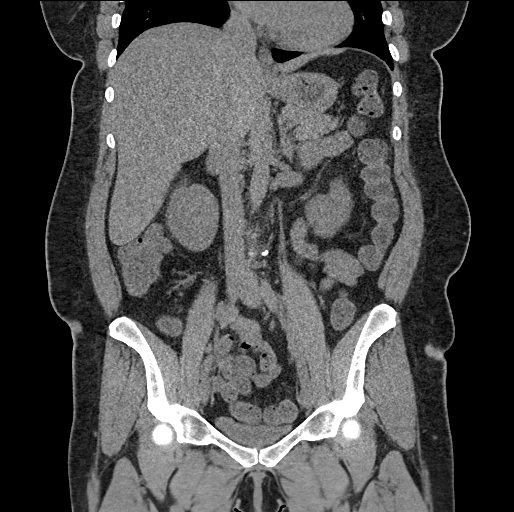

[16 of 46 positions shown; findings below may reference images not displayed]

FINDINGS: Lower chest: Scattered areas of mild scarring in the lung bases.

Hepatobiliary: Mild diffuse low attenuation throughout the hepatic
parenchyma, indicative of a background of hepatic steatosis. No
definite suspicious hepatic lesions are confidently identified on
today's noncontrast CT examination. Unenhanced appearance of the
gallbladder is normal.

Pancreas: No definite pancreatic mass or peripancreatic fluid
collections or inflammatory changes are noted on today's noncontrast
CT examination.

Spleen: Unremarkable.

Adrenals/Urinary Tract: There are no abnormal calcifications within
the collecting system of either kidney, along the course of either
ureter, or within the lumen of the urinary bladder. No
hydroureteronephrosis or perinephric stranding to suggest urinary
tract obstruction at this time. The unenhanced appearance of the
kidneys is unremarkable bilaterally. Unenhanced appearance of the
urinary bladder is normal. Bilateral adrenal glands are normal in
appearance.

Stomach/Bowel: Unenhanced appearance of the stomach is normal. There
is no pathologic dilatation of small bowel or colon. Normal
appendix.

Vascular/Lymphatic: No atherosclerotic calcifications are noted in
the abdominal aorta or pelvic vasculature. No lymphadenopathy noted
in the abdomen or pelvis.

Reproductive: Unenhanced appearance of the uterus and ovaries is
unremarkable.

Other: No significant volume of ascites.  No pneumoperitoneum.

Musculoskeletal: There are no aggressive appearing lytic or blastic
lesions noted in the visualized portions of the skeleton.
IMPRESSION: 1. There are no acute findings noted in the abdomen or pelvis to
account for the patient's symptoms.
2. Hepatic steatosis

## 2024-04-25 ENCOUNTER — Other Ambulatory Visit: Payer: Self-pay | Admitting: Family Medicine

## 2024-04-25 DIAGNOSIS — Z1231 Encounter for screening mammogram for malignant neoplasm of breast: Secondary | ICD-10-CM

## 2024-04-26 ENCOUNTER — Ambulatory Visit
Admission: RE | Admit: 2024-04-26 | Discharge: 2024-04-26 | Disposition: A | Discharge status: Home / Self Care | Source: Ambulatory Visit | Attending: Family Medicine | Admitting: Family Medicine

## 2024-04-26 DIAGNOSIS — Z1231 Encounter for screening mammogram for malignant neoplasm of breast: Secondary | ICD-10-CM

## 2024-05-02 ENCOUNTER — Other Ambulatory Visit: Payer: Self-pay | Admitting: Family Medicine

## 2024-05-02 DIAGNOSIS — R928 Other abnormal and inconclusive findings on diagnostic imaging of breast: Secondary | ICD-10-CM

## 2024-05-05 ENCOUNTER — Other Ambulatory Visit: Payer: Self-pay | Admitting: Family Medicine

## 2024-05-05 ENCOUNTER — Ambulatory Visit
Admission: RE | Admit: 2024-05-05 | Discharge: 2024-05-05 | Disposition: A | Discharge status: Home / Self Care | Source: Ambulatory Visit | Attending: Family Medicine | Admitting: Family Medicine

## 2024-05-05 ENCOUNTER — Ambulatory Visit

## 2024-05-05 DIAGNOSIS — R928 Other abnormal and inconclusive findings on diagnostic imaging of breast: Secondary | ICD-10-CM

## 2024-05-10 ENCOUNTER — Ambulatory Visit
Admission: RE | Admit: 2024-05-10 | Discharge: 2024-05-10 | Disposition: A | Discharge status: Home / Self Care | Source: Ambulatory Visit | Attending: Family Medicine | Admitting: Family Medicine

## 2024-05-10 DIAGNOSIS — R928 Other abnormal and inconclusive findings on diagnostic imaging of breast: Secondary | ICD-10-CM

## 2024-05-10 HISTORY — PX: BREAST BIOPSY: SHX20

## 2024-05-11 LAB — SURGICAL PATHOLOGY
